# Patient Record
Sex: Female | Born: 1990 | Race: White | Hispanic: No | Marital: Married | Smoking: Never smoker
Health system: Southern US, Community
[De-identification: ages and names within clinical notes are randomized; demographics above are authoritative.]

## PROBLEM LIST (undated history)

## (undated) DIAGNOSIS — F32A Depression, unspecified: Secondary | ICD-10-CM

## (undated) DIAGNOSIS — Z9889 Other specified postprocedural states: Secondary | ICD-10-CM

## (undated) DIAGNOSIS — K219 Gastro-esophageal reflux disease without esophagitis: Secondary | ICD-10-CM

## (undated) DIAGNOSIS — N809 Endometriosis, unspecified: Secondary | ICD-10-CM

## (undated) DIAGNOSIS — F329 Major depressive disorder, single episode, unspecified: Secondary | ICD-10-CM

## (undated) DIAGNOSIS — F909 Attention-deficit hyperactivity disorder, unspecified type: Secondary | ICD-10-CM

## (undated) DIAGNOSIS — K648 Other hemorrhoids: Secondary | ICD-10-CM

## (undated) DIAGNOSIS — R51 Headache: Secondary | ICD-10-CM

## (undated) DIAGNOSIS — E559 Vitamin D deficiency, unspecified: Secondary | ICD-10-CM

## (undated) DIAGNOSIS — R112 Nausea with vomiting, unspecified: Secondary | ICD-10-CM

## (undated) DIAGNOSIS — N80129 Deep endometriosis of ovary, unspecified ovary: Secondary | ICD-10-CM

## (undated) DIAGNOSIS — Z975 Presence of (intrauterine) contraceptive device: Secondary | ICD-10-CM

## (undated) DIAGNOSIS — F419 Anxiety disorder, unspecified: Secondary | ICD-10-CM

## (undated) HISTORY — PX: HEMORRHOID BANDING: SHX5850

## (undated) HISTORY — DX: Anxiety disorder, unspecified: F41.9

## (undated) HISTORY — DX: Other hemorrhoids: K64.8

## (undated) HISTORY — DX: Headache: R51

## (undated) HISTORY — DX: Endometriosis, unspecified: N80.9

## (undated) HISTORY — DX: Major depressive disorder, single episode, unspecified: F32.9

## (undated) HISTORY — DX: Presence of (intrauterine) contraceptive device: Z97.5

## (undated) HISTORY — PX: HAND SURGERY: SHX662

## (undated) HISTORY — PX: BREAST SURGERY: SHX581

## (undated) HISTORY — PX: FOOT SURGERY: SHX648

## (undated) HISTORY — PX: WISDOM TOOTH EXTRACTION: SHX21

## (undated) HISTORY — DX: Depression, unspecified: F32.A

---

## 2011-02-06 ENCOUNTER — Emergency Department (HOSPITAL_COMMUNITY)
Admission: EM | Admit: 2011-02-06 | Discharge: 2011-02-07 | Disposition: A | Discharge status: Home / Self Care | Payer: Self-pay | Attending: Emergency Medicine | Admitting: Emergency Medicine

## 2011-02-06 DIAGNOSIS — Z0389 Encounter for observation for other suspected diseases and conditions ruled out: Secondary | ICD-10-CM | POA: Insufficient documentation

## 2012-08-30 ENCOUNTER — Ambulatory Visit: Payer: PRIVATE HEALTH INSURANCE | Admitting: Family Medicine

## 2012-08-30 VITALS — BP 114/74 | HR 81 | Temp 99.6°F | Resp 16 | Ht 63.0 in | Wt 136.6 lb

## 2012-08-30 DIAGNOSIS — B9689 Other specified bacterial agents as the cause of diseases classified elsewhere: Secondary | ICD-10-CM

## 2012-08-30 DIAGNOSIS — N760 Acute vaginitis: Secondary | ICD-10-CM

## 2012-08-30 DIAGNOSIS — N898 Other specified noninflammatory disorders of vagina: Secondary | ICD-10-CM

## 2012-08-30 LAB — POCT WET PREP WITH KOH
KOH Prep POC: NEGATIVE
RBC Wet Prep HPF POC: NEGATIVE
Trichomonas, UA: NEGATIVE
Yeast Wet Prep HPF POC: NEGATIVE

## 2012-08-30 MED ORDER — METRONIDAZOLE 500 MG PO TABS: 500.0000 mg | ORAL_TABLET | Freq: Two times a day (BID) | ORAL | Status: DC

## 2012-08-30 MED ORDER — FLUCONAZOLE 150 MG PO TABS: 150.0000 mg | ORAL_TABLET | Freq: Once | ORAL | Status: DC

## 2012-08-30 NOTE — Progress Notes (Signed)
[facility]:  Office Visit  Chief Complaint:  Chief Complaint  Patient presents with  . Vaginal Discharge    x 3 days  white/ thick   . Vaginal Itching    x 3 days     HPI: Jenny Delgado is a 21 y.o. female who complains of  Itching, white dc, smell. She gets this frequently so knows the symptoms.  No prior STDs. LMP 08/07/2012. Frequent BV after periods, using refresh gel.   Past Medical History  Diagnosis Date  . Anxiety   . Depression    Past Surgical History  Procedure Date  . Cesarean section    History   Social History  . Marital Status: Single    Spouse Name: N/A    Number of Children: N/A  . Years of Education: N/A   Social History Main Topics  . Smoking status: Never Smoker   . Smokeless tobacco: None  . Alcohol Use: No  . Drug Use: No  . Sexually Active: Yes    Birth Control/ Protection: None   Other Topics Concern  . None   Social History Narrative  . None   Family History  Problem Relation Age of Onset  . Hypertension Father   . Diabetes Maternal Grandfather    No Known Allergies Prior to Admission medications   Not on File     ROS: The patient denies fevers, chills, night sweats, unintentional weight loss, chest pain, palpitations, wheezing, dyspnea on exertion, nausea, vomiting, abdominal pain, dysuria, hematuria, melena, numbness, weakness, or tingling.   All other systems have been reviewed and were otherwise negative with the exception of those mentioned in the HPI and as above.    PHYSICAL EXAM: Filed Vitals:   08/30/12 1611  BP: 114/74  Pulse: 81  Temp: 99.6 F (37.6 C)  Resp: 16   Filed Vitals:   08/30/12 1611  Height: 5\' 3"  (1.6 m)  Weight: 136 lb 9.6 oz (61.961 kg)   Body mass index is 24.20 kg/(m^2).  General: Alert, no acute distress HEENT:  Normocephalic, atraumatic, oropharynx patent.  Cardiovascular:  Regular rate and rhythm, no rubs murmurs or gallops.  No Carotid bruits, radial pulse  intact. No pedal edema.  Respiratory: Clear to auscultation bilaterally.  No wheezes, rales, or rhonchi.  No cyanosis, no use of accessory musculature GI: No organomegaly, abdomen is soft and non-tender, positive bowel sounds.  No masses. Skin: No rashes. Neurologic: Facial musculature symmetric. Psychiatric: Patient is appropriate throughout our interaction. Lymphatic: No cervical lymphadenopathy Musculoskeletal: Gait intact. Gu-white dc, malodorus, masses or lesions, no CMT, cervix nl   LABS: Results for orders placed in visit on 08/30/12  POCT WET PREP WITH KOH      Component Value Range   Trichomonas, UA Negative     Clue Cells Wet Prep HPF POC 1-3     Epithelial Wet Prep HPF POC 3-7     Yeast Wet Prep HPF POC negative     Bacteria Wet Prep HPF POC 2+     RBC Wet Prep HPF POC negative     WBC Wet Prep HPF POC 1-5     KOH Prep POC Negative       EKG/XRAY:   Primary read interpreted by Dr. Con[facility] Rolls at [facility].   ASSESSMENT/PLAN: Encounter Diagnoses  Name Primary?  . Vaginal discharge Yes  . Bacterial vaginosis     Rx Flagyl and Diflucan prn   Jenny Dry PHUONG, DO 08/30/2012 11:38 PM

## 2012-12-28 ENCOUNTER — Encounter: Payer: Self-pay | Admitting: Obstetrics and Gynecology

## 2012-12-28 ENCOUNTER — Ambulatory Visit: Payer: No Typology Code available for payment source | Admitting: Obstetrics and Gynecology

## 2012-12-28 VITALS — BP 126/62 | Ht 64.0 in | Wt 139.0 lb

## 2012-12-28 DIAGNOSIS — B9689 Other specified bacterial agents as the cause of diseases classified elsewhere: Secondary | ICD-10-CM

## 2012-12-28 DIAGNOSIS — Z01419 Encounter for gynecological examination (general) (routine) without abnormal findings: Secondary | ICD-10-CM

## 2012-12-28 DIAGNOSIS — Z113 Encounter for screening for infections with a predominantly sexual mode of transmission: Secondary | ICD-10-CM

## 2012-12-28 LAB — POCT URINALYSIS DIPSTICK
Blood, UA: NEGATIVE
Protein, UA: NEGATIVE
Spec Grav, UA: 1.015
Urobilinogen, UA: NEGATIVE
pH, UA: 6

## 2012-12-28 MED ORDER — TINIDAZOLE 500 MG PO TABS: ORAL_TABLET | ORAL | Status: DC

## 2012-12-28 MED ORDER — NONFORMULARY OR COMPOUNDED ITEM: Status: DC

## 2012-12-28 NOTE — Progress Notes (Signed)
The patient reports:frequent vaginal itching and burning with odor. Pt thinks it happens at the end of her cycles on and off x 7 years. Uses RePHresh OTC which used to help but not recently.  Contraception:None   Last mammogram:none Last pap: 12/2011 per pt Normal  Pt states had colposcopy in 2006 or 2007.   GC/Chlamydia cultures offered: requested HIV/RPR/HbsAg offered:  requested HSV 1 and 2 glycoprotein offered: requested  Menstrual cycle regular and monthly: Yes every 30 - 32 days  Menstrual flow normal: Yes lasts 5-6 days with cramping on day 2 and day 3   Urinary symptoms: yes burning  Normal bowel movements: Yes Reports abuse at home: No:   Subjective:    Jenny Delgado is a 22 y.o. female, No obstetric history on file., who presents for an annual exam.     History   Social History  . Marital Status: Single    Spouse Name: N/A    Number of Children: N/A  . Years of Education: N/A   Social History Main Topics  . Smoking status: Never Smoker   . Smokeless tobacco: Not on file  . Alcohol Use: No  . Drug Use: No  . Sexually Active: Yes    Birth Control/ Protection: None   Other Topics Concern  . Not on file   Social History Narrative  . No narrative on file    Menstrual cycle:   LMP: Patient's last menstrual period was 12/10/2012.           Cycle: normal  The following portions of the patient's history were reviewed and updated as appropriate: allergies, current medications, past family history, past medical history, past social history, past surgical history and problem list.  Review of Systems Pertinent items are noted in HPI. Breast:Negative for breast lump,nipple discharge or nipple retraction Gastrointestinal: Negative for abdominal pain, change in bowel habits or rectal bleeding Urinary:negative   Objective:    BP 126/62  Ht 5\' 4"  (1.626 m)  Wt 139 lb (63.05 kg)  BMI 23.85 kg/m2  LMP 12/10/2012    Weight:  Wt Readings from Last 1 Encounters:   08/30/12 136 lb 9.6 oz (61.961 kg)          BMI: Body mass index is 23.85 kg/(m^2).  General Appearance: Alert, appropriate appearance for age. No acute distress HEENT: Grossly normal Neck / Thyroid: Supple, no masses, nodes or enlargement Lungs: clear to auscultation bilaterally Back: No CVA tenderness Breast Exam: No masses or nodes.No dimpling, nipple retraction or discharge. Cardiovascular: Regular rate and rhythm. S1, S2, no murmur Gastrointestinal: Soft, non-tender, no masses or organomegaly Pelvic Exam: Vulva and vagina appear normal. Bimanual exam reveals normal uterus and adnexa. Rectovaginal: not indicated Lymphatic Exam: Non-palpable nodes in neck, clavicular, axillary, or inguinal regions  Skin: no rash or abnormalities Neurologic: Normal gait and speech, no tremor  Psychiatric: Alert and oriented, appropriate affect.   OSOM BV: + Wet Prep: pH 5.0  Clues no yeast  Assessment:    Normal gyn exam  BV   Plan:    pap smear done, next pap due 2017 return annually or prn STD screening: done Contraception:no method Tindamax Boric Acid Suppositories Given   Silverio Lay MD

## 2012-12-28 NOTE — Addendum Note (Signed)
Addended by: Darien Ramus on: 12/28/2012 03:57 PM   Modules accepted: Orders

## 2012-12-29 LAB — HIV ANTIBODY (ROUTINE TESTING W REFLEX): HIV: NONREACTIVE

## 2012-12-29 LAB — HSV 2 ANTIBODY, IGG: HSV 2 Glycoprotein G Ab, IgG: <0.1 IV

## 2013-01-01 LAB — PAP IG, CT-NG, RFX HPV ASCU: Chlamydia Probe Amp: NEGATIVE

## 2013-01-01 NOTE — Progress Notes (Signed)
Quick Note:  LGSIL with unknown HPV: per [facility], repeat Pap in 1 year. Also please inform patient HSV 1 + ______

## 2013-01-03 ENCOUNTER — Telehealth: Payer: Self-pay

## 2013-01-03 NOTE — Telephone Encounter (Signed)
LVM for pt to return call.   Gavyn Zoss, CMA  

## 2013-01-03 NOTE — Telephone Encounter (Signed)
Message copied by Darien Ramus on Wed Jan 03, 2013  8:58 AM ------      Message from: Silverio Lay      Created: Mon Jan 01, 2013  8:21 PM       LGSIL with unknown HPV: per [facility], repeat Pap in 1 year.      Also please inform patient HSV 1 + ------

## 2013-01-08 NOTE — Telephone Encounter (Signed)
lvm for return call  Darien Ramus, CMA

## 2013-07-24 ENCOUNTER — Ambulatory Visit (INDEPENDENT_AMBULATORY_CARE_PROVIDER_SITE_OTHER): Payer: PRIVATE HEALTH INSURANCE | Admitting: Family Medicine

## 2013-07-24 VITALS — BP 118/62 | HR 89 | Temp 98.1°F | Resp 18 | Ht 62.75 in | Wt 138.0 lb

## 2013-07-24 DIAGNOSIS — B9689 Other specified bacterial agents as the cause of diseases classified elsewhere: Secondary | ICD-10-CM

## 2013-07-24 DIAGNOSIS — N898 Other specified noninflammatory disorders of vagina: Secondary | ICD-10-CM

## 2013-07-24 LAB — POCT WET PREP WITH KOH: Yeast Wet Prep HPF POC: NEGATIVE

## 2013-07-24 MED ORDER — METRONIDAZOLE 500 MG PO TABS: ORAL_TABLET | ORAL | Status: DC

## 2013-07-24 MED ORDER — FLUCONAZOLE 150 MG PO TABS: 150.0000 mg | ORAL_TABLET | Freq: Once | ORAL | Status: DC

## 2013-07-24 NOTE — Progress Notes (Signed)
[facility] [address] [place] [address] [phone]  Date:  07/24/2013   Name:  Jenny Delgado   DOB:  1991-01-27   MRN:  [id number]  PCP:  Pcp Not In System    Chief Complaint: vaginal discharge and odor   History of Present Illness:  Jenny Delgado is a 22 y.o. very pleasant female patient who presents with the following:  She is here with possible BV- she tends to get this a few times a year and has done so for a long time.   She has noted discharge and odor, no pain.    She is otherwise generally healthy except for anemia.   LMP 07/07/13.   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    She would like to try the 1 day flagyl course, and a diflucan She was recently tested for STI and was all clear.   There are no active problems to display for this patient.   Past Medical History  Diagnosis Date  . Anxiety   . Depression   . GNFAOZHY(865.7)     Past Surgical History  Procedure Laterality Date  . Wisdom tooth extraction    . Cesarean section  2008    History  Substance Use Topics  . Smoking status: Never Smoker   . Smokeless tobacco: Never Used  . Alcohol Use: Yes     Comment: ocassional wine coolers     Family History  Problem Relation Age of Onset  . Hypertension Father   . Diabetes Maternal Grandfather     No Known Allergies  Medication list has been reviewed and updated.  Current Outpatient Prescriptions on File Prior to Visit  Medication Sig Dispense Refill  . Aspirin-Salicylamide-Caffeine (BC HEADACHE POWDER PO) Take by mouth.      . fluconazole (DIFLUCAN) 150 MG tablet Take 1 tablet (150 mg total) by mouth once. May repeat prn  2 tablet  0  . metroNIDAZOLE (FLAGYL) 500 MG tablet Take 1 tablet (500 mg total) by mouth 2 (two) times daily.  14 tablet  0  . NONFORMULARY OR COMPOUNDED ITEM Boric Acid 600 mg vaginal suppositories. Place vaginally PRN  3 each  11  . tinidazole (TINDAMAX) 500 MG tablet Take 4  tablets by mouth today and tomorrow  8 tablet  0   No current facility-administered medications on file prior to visit.    Review of Systems:  As per HPI- otherwise negative.   Physical Examination: Filed Vitals:   07/24/13 1144  BP: 118/62  Pulse: 89  Temp: 98.1 F (36.7 C)  Resp: 18   Filed Vitals:   07/24/13 1144  Height: 5' 2.75" (1.594 m)  Weight: 138 lb (62.596 kg)   Body mass index is 24.64 kg/(m^2). Ideal Body Weight: Weight in (lb) to have BMI = 25: 139.7  GEN: WDWN, NAD, Non-toxic, A & O x 3, looks well HEENT: Atraumatic, Normocephalic. Neck supple. No masses, No LAD. Ears and Nose: No external deformity. CV: RRR, No M/G/R. No JVD. No thrill. No extra heart sounds. PULM: CTA B, no wheezes, crackles, rhonchi. No retractions. No resp. distress. No accessory muscle use. ABD: S, NT, ND. No rebound. No HSM. EXTR: No c/c/e NEURO Normal gait.  PSYCH: Normally interactive. Conversant. Not depressed or anxious appearing.  Calm demeanor.  Pt did a self- collect [facility]  Results for orders placed in visit on 07/24/13  POCT WET PREP WITH KOH      Result  Value Range   Trichomonas, UA Negative     Clue Cells Wet Prep HPF POC 1-3     Epithelial Wet Prep HPF POC 1-7     Yeast Wet Prep HPF POC neg     Bacteria Wet Prep HPF POC 2+     RBC Wet Prep HPF POC 1-3     WBC Wet Prep HPF POC 2-5     KOH Prep POC Negative      Assessment and Plan: Vaginal discharge - Plan: POCT Wet Prep with KOH, metroNIDAZOLE (FLAGYL) 500 MG tablet, fluconazole (DIFLUCAN) 150 MG tablet  Bacterial vaginosis - Plan: metroNIDAZOLE (FLAGYL) 500 MG tablet, fluconazole (DIFLUCAN) 150 MG tablet  Treat for BV and prevent yeast vaginitis with flagyl and diflucan. She will let me know if not better- Sooner if worse.     Signed Abbe Amsterdam, MD

## 2015-03-27 ENCOUNTER — Encounter: Payer: Self-pay | Admitting: Obstetrics and Gynecology

## 2015-03-27 ENCOUNTER — Ambulatory Visit (INDEPENDENT_AMBULATORY_CARE_PROVIDER_SITE_OTHER): Payer: 59 | Admitting: Obstetrics and Gynecology

## 2015-03-27 VITALS — BP 109/72 | HR 76 | Ht 63.0 in | Wt 144.7 lb

## 2015-03-27 DIAGNOSIS — F329 Major depressive disorder, single episode, unspecified: Secondary | ICD-10-CM | POA: Insufficient documentation

## 2015-03-27 DIAGNOSIS — F32A Depression, unspecified: Secondary | ICD-10-CM | POA: Insufficient documentation

## 2015-03-27 DIAGNOSIS — Z975 Presence of (intrauterine) contraceptive device: Secondary | ICD-10-CM | POA: Insufficient documentation

## 2015-03-27 DIAGNOSIS — F419 Anxiety disorder, unspecified: Secondary | ICD-10-CM

## 2015-03-27 DIAGNOSIS — Z30432 Encounter for removal of intrauterine contraceptive device: Secondary | ICD-10-CM

## 2015-03-27 DIAGNOSIS — Z8742 Personal history of other diseases of the female genital tract: Secondary | ICD-10-CM | POA: Insufficient documentation

## 2015-03-27 NOTE — Patient Instructions (Signed)
Begin taking a prenatal vitamin daily.  Follow up as needed.

## 2015-03-27 NOTE — Procedures (Signed)
GYNECOLOGY CLINIC PROCEDURE NOTE  HPI:  Jenny Delgado is a 24 y.o. [phone] here for Mirena IUD removal. No GYN concerns.  Desires to attempt to conceive again.  ROS: Review of Systems negative  Exam:  Blood pressure 109/72, pulse 76, height 5\' 3"  (1.6 m), weight 144 lb 11.2 oz (65.635 kg). Deferred.   IUD Removal Procedure Note Patient was placed in the dorsal lithotomy position, normal external genitalia was noted.  A speculum was placed in the patient's vagina, normal discharge was noted, no lesions. The cervix was visualized, no lesions, no abnormal discharge.  The strings of the IUD were grasped and pulled using ring forceps. The IUD was removed in its entirety. Patient tolerated the procedure well.   Plan: Patient plans for pregnancy soon and she was told to avoid teratogens, take PNV and folic acid.  Routine preventative health maintenance measures emphasized.  Hildred Laser, MD [facility]

## 2015-03-27 NOTE — Progress Notes (Signed)
See procedure note for details of procedure.  Patient presented for IUD removal.

## 2015-05-28 ENCOUNTER — Telehealth: Payer: Self-pay | Admitting: Obstetrics and Gynecology

## 2015-05-28 NOTE — Telephone Encounter (Signed)
PT CALLED AND SHE IS SET UP TO COME IN NEXT WEEK FOR HER CONFIRAMTION, AND ACCORDING TO HER LMP SHE WILL BE AROUNF 8 WEEKS, AND SHE IS HAVING SOME CRAMPING AND BLEEDING AND SHE DIDN'T KNOW WHAT SHE NEEDED TO DO. 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

## 2015-05-29 NOTE — Telephone Encounter (Addendum)
Wearing maxi-pad, maybe in a day 1/3 full and min. Cramping. Coming in for [place] 05/30/15 per Dr. Valentino Saxon.

## 2015-05-30 ENCOUNTER — Encounter: Payer: Self-pay | Admitting: Obstetrics and Gynecology

## 2015-05-30 ENCOUNTER — Ambulatory Visit: Payer: 59

## 2015-05-30 ENCOUNTER — Ambulatory Visit (INDEPENDENT_AMBULATORY_CARE_PROVIDER_SITE_OTHER): Payer: 59 | Admitting: Obstetrics and Gynecology

## 2015-05-30 VITALS — BP 102/64 | HR 67 | Ht 63.0 in | Wt 145.4 lb

## 2015-05-30 DIAGNOSIS — O021 Missed abortion: Secondary | ICD-10-CM | POA: Diagnosis not present

## 2015-05-30 DIAGNOSIS — N8320 Unspecified ovarian cysts: Secondary | ICD-10-CM | POA: Diagnosis not present

## 2015-05-30 DIAGNOSIS — Z3687 Encounter for antenatal screening for uncertain dates: Secondary | ICD-10-CM

## 2015-05-30 DIAGNOSIS — N83202 Unspecified ovarian cyst, left side: Secondary | ICD-10-CM | POA: Insufficient documentation

## 2015-05-30 DIAGNOSIS — O4690 Antepartum hemorrhage, unspecified, unspecified trimester: Secondary | ICD-10-CM

## 2015-05-30 MED ORDER — MISOPROSTOL 200 MCG PO TABS: 800.0000 ug | ORAL_TABLET | Freq: Once | ORAL | Status: DC

## 2015-05-30 MED ORDER — HYDROCODONE-ACETAMINOPHEN 5-325 MG PO TABS: 1.0000 | ORAL_TABLET | Freq: Four times a day (QID) | ORAL | Status: DC | PRN

## 2015-05-30 NOTE — Patient Instructions (Signed)
Miscarriage °A miscarriage is the loss of an unborn baby (fetus) before the 20th week of pregnancy. The cause is often unknown.  °HOME CARE °· You may need to stay in bed (bed rest), or you may be able to do light activity. Go about activity as told by your doctor. °· Have help at home. °· Write down how many pads you use each day. Write down how soaked they are. °· Do not use tampons. Do not wash out your vagina (douche) or have sex (intercourse) until your doctor approves. °· Only take medicine as told by your doctor. °· Do not take aspirin. °· Keep all doctor visits as told. °· If you or your partner have problems with grieving, talk to your doctor. You can also try counseling. Give yourself time to grieve before trying to get pregnant again. °GET HELP RIGHT AWAY IF: °· You have bad cramps or pain in your back or belly (abdomen). °· You have a fever. °· You pass large clumps of blood (clots) from your vagina that are walnut-sized or larger. Save the clumps for your doctor to see. °· You pass large amounts of tissue from your vagina. Save the tissue for your doctor to see. °· You have more bleeding. °· You have thick, bad-smelling fluid (discharge) coming from the vagina. °· You get lightheaded, weak, or you pass out (faint). °· You have chills. °MAKE SURE YOU: °· Understand these instructions. °· Will watch your condition. °· Will get help right away if you are not doing well or get worse. °Document Released: 12/27/2011 Document Reviewed: 12/27/2011 °ExitCare® Patient Information ©2015 [employer]. This information is not intended to replace advice given to you by your health care provider. Make sure you discuss any questions you have with your health care provider. ° °

## 2015-05-30 NOTE — Progress Notes (Unsigned)
Patient ID: Jenny Delgado, female   DOB: December 08, 1990, 24 y.o.   MRN: [id number] Pt in today for ultrasound per Dr. Valentino Saxon due to vaginal spotting, 1/3 of pad all day and some mild abdominal cramping. Positive home pregnancy test, for confirmation next week.

## 2015-05-30 NOTE — Progress Notes (Signed)
Patient ID: Jenny Delgado, female   DOB: [birth year], 24 y.o.   MRN: [id number] Pt presents for f/u u/s. Pos upt at home. Now bleeding.

## 2015-05-30 NOTE — Progress Notes (Addendum)
Subjective:    Carnisha Feltz is a 24 y.o. [phone] female. LMP 04/05/2015, with EDD 01/03/2016 by dates, EGA [redacted]w[redacted]d. Positive UPT at home.  Sudie reports bleeding since 3 days ago, light, requiring 1 pad daily.  Denies passage of tissue products or blood products. Does note some mild occasional cramping.  She is not in acute distress. Ectopic risks: none.      The following portions of the patient's history were reviewed and updated as appropriate: allergies, current medications, past family history, past medical history, past social history, past surgical history and problem list.  Review of Systems Pertinent items are noted in HPI.   Objective:     BP 102/64 mmHg  Pulse 67  Ht  (1.6 m)  Wt 145 lb 6.4 oz (65.953 kg)  BMI 25.76 kg/m2 General:   alert and no distress  Abdomen: soft, non-tender, without masses or organomegaly  Pelvic: Vulva and vagina appear normal. Bimanual exam reveals normal uterus and adnexa.  Cervix closed. Scant blood in vaginal vault.    Imaging Office ultrasound 05/30/2015:   Findings:  Singleton intrauterine pregnancy is visualized with a CRL consistent with 6 0/[redacted] weeks gestation. No FHB was visualized by color doppler; m-mode; or PW. The (U/S) EDD is NOT consistent with the clinically established (LMP) EDD of 01-03-16.  FHR: 0 CRL measurement: 3.2 mm  Right Ovary measures 1.7 x 1.6 x 1.4 cm. It is normal in appearance.  Left Ovary measures 4.8 x 2.9 x 3.1 cm. It contains a simple appearing cyst measuring 3.6 x 2.6 x 4.0 cm.  Survey of the adnexa demonstrates no adnexal masses.  There is no free peritoneal fluid in the cul de sac.    Assessment:     History of habitual abortion Missed abortion at [redacted] weeks gestation     4 cm left ovarian cyst, simple  Plan:   Discussed management of missed abortion: expectant management vs misoprostol vs D&C.  Risks and benefits of all modalities discussed; all questions answered.  Patient opted for medical  management with Cytotec.  She was told to call if she changes her mind, or if there is no passage of tissue after 2nd dose of Cytotec (to be taken 24-48 hours after initial dose if no bleeding occurs) or develops any symptoms of infection.  Bleeding precautions reviewed; she was told to call clinic or go to the Emergency Room for any concerns.  Patient understands that if products are still retained, she would need to progress to a D&C at that time.  Blood type and Rh: pending. Quantitative hCG today. Simple left ovarian cyst.  Asymptomatic.  Will likely resolve without intervention.  Will f/u on next scan.  Follow-up appointment with 1 week for repeat labs and ultrasound to ensure expulsion of all products.  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   Hildred Laser, MD [facility]

## 2015-05-31 LAB — BETA HCG QUANT (REF LAB): hCG Quant: 17912 m[IU]/mL

## 2015-06-03 ENCOUNTER — Emergency Department (HOSPITAL_COMMUNITY)
Admission: EM | Admit: 2015-06-03 | Discharge: 2015-06-03 | Disposition: A | Discharge status: Home / Self Care | Payer: 59 | Attending: Emergency Medicine | Admitting: Emergency Medicine

## 2015-06-03 ENCOUNTER — Encounter (HOSPITAL_COMMUNITY): Payer: Self-pay | Admitting: Emergency Medicine

## 2015-06-03 ENCOUNTER — Ambulatory Visit: Payer: 59 | Admitting: Obstetrics and Gynecology

## 2015-06-03 ENCOUNTER — Emergency Department (HOSPITAL_COMMUNITY): Payer: 59

## 2015-06-03 DIAGNOSIS — O209 Hemorrhage in early pregnancy, unspecified: Secondary | ICD-10-CM | POA: Diagnosis present

## 2015-06-03 DIAGNOSIS — O039 Complete or unspecified spontaneous abortion without complication: Secondary | ICD-10-CM | POA: Insufficient documentation

## 2015-06-03 DIAGNOSIS — Z8659 Personal history of other mental and behavioral disorders: Secondary | ICD-10-CM | POA: Insufficient documentation

## 2015-06-03 DIAGNOSIS — Z3A01 Less than 8 weeks gestation of pregnancy: Secondary | ICD-10-CM | POA: Insufficient documentation

## 2015-06-03 LAB — BASIC METABOLIC PANEL
Anion gap: 7 (ref 5–15)
BUN: 8 mg/dL (ref 6–20)
CO2: 26 mmol/L (ref 22–32)
CREATININE: 0.6 mg/dL (ref 0.44–1.00)
Calcium: 8.9 mg/dL (ref 8.9–10.3)
Chloride: 105 mmol/L (ref 101–111)
GFR calc Af Amer: >60 mL/min (ref >60)
Glucose, Bld: 103 mg/dL — ABNORMAL HIGH (ref 65–99)
Potassium: 3.9 mmol/L (ref 3.5–5.1)
SODIUM: 138 mmol/L (ref 135–145)

## 2015-06-03 LAB — CBC WITH DIFFERENTIAL/PLATELET
Basophils Absolute: 0 10*3/uL (ref 0.0–0.1)
Basophils Relative: 0 % (ref 0–1)
EOS ABS: 0.1 10*3/uL (ref 0.0–0.7)
Eosinophils Relative: 2 % (ref 0–5)
HCT: 34.2 % — ABNORMAL LOW (ref 36.0–46.0)
Hemoglobin: 11.3 g/dL — ABNORMAL LOW (ref 12.0–15.0)
LYMPHS ABS: 1.5 10*3/uL (ref 0.7–4.0)
Lymphocytes Relative: 18 % (ref 12–46)
MCH: 25.7 pg — AB (ref 26.0–34.0)
MCHC: 33 g/dL (ref 30.0–36.0)
MCV: 77.7 fL — ABNORMAL LOW (ref 78.0–100.0)
MONOS PCT: 4 % (ref 3–12)
Monocytes Absolute: 0.3 10*3/uL (ref 0.1–1.0)
Neutro Abs: 6.4 10*3/uL (ref 1.7–7.7)
Neutrophils Relative %: 76 % (ref 43–77)
PLATELETS: 159 10*3/uL (ref 150–400)
RBC: 4.4 MIL/uL (ref 3.87–5.11)
RDW: 14.2 % (ref 11.5–15.5)
WBC: 8.4 10*3/uL (ref 4.0–10.5)

## 2015-06-03 MED ORDER — IBUPROFEN 800 MG PO TABS: 800.0000 mg | ORAL_TABLET | Freq: Three times a day (TID) | ORAL | Status: DC

## 2015-06-03 MED ORDER — HYDROCODONE-ACETAMINOPHEN 5-325 MG PO TABS
2.0000 | ORAL_TABLET | Freq: Once | ORAL | Status: AC
Start: 2015-06-03 — End: 2015-06-03
  Administered 2015-06-03: 2 via ORAL
  Filled 2015-06-03: qty 2

## 2015-06-03 MED ORDER — IBUPROFEN 800 MG PO TABS
800.0000 mg | ORAL_TABLET | Freq: Once | ORAL | Status: AC
  Administered 2015-06-03: 800 mg via ORAL
  Filled 2015-06-03: qty 1

## 2015-06-03 NOTE — ED Notes (Signed)
Pt was given Cytotec on Friday by Dr Valentino Saxon in [place] to facilitate miscarriage.  Woke up at 2 am with several cramping, passing clots and lightheadedness.  C/o lower abdominal cramping.  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  Have one living child.

## 2015-06-03 NOTE — ED Provider Notes (Signed)
CSN: [id number]     Arrival date & time 06/03/15  0732 History  This chart was scribed for Eber Hong, MD by Octavia Heir, ED Scribe. This patient was seen in room APA11/APA11 and the patient's care was started at 8:26 AM.    Chief Complaint  Patient presents with  . Vaginal Bleeding      The history is provided by the patient. No language interpreter was used.   HPI Comments: Jenny Delgado is a 24 y.o. female who is [redacted] weeks pregnant presents to the Emergency Department complaining of constant, gradual worsening vaginal bleeding onset last night. Pt has had associated abdominal cramps for the past 2 days, increased pain in the past 24 hours and reports several large clots this morning. Pt saw her Ob/gyn on 05/30/15 and received Cytotec to facilitate passage of uterine contents. She has past medcial hx of having one miscarriage previously. Pt states there are no modifying or aggravating factors. She denies fevers and vomiting.  Ob/Gyn: Dr. Valentino Saxon  Past Medical History  Diagnosis Date  . Anxiety   . Depression   . Headache(784.0)   . IUD contraception     mirena   Past Surgical History  Procedure Laterality Date  . Wisdom tooth extraction    . Cesarean section  2008   Family History  Problem Relation Age of Onset  . Hypertension Father   . Diabetes Maternal Grandfather   . Breast cancer Neg Hx   . Cervical cancer Neg Hx   . Colon cancer Neg Hx   . Ovarian cancer Neg Hx   . Heart disease Neg Hx    Social History  Substance Use Topics  . Smoking status: Never Smoker   . Smokeless tobacco: Never Used  . Alcohol Use: Yes     Comment: ocassional wine coolers    OB History    Gravida Para Term Preterm AB TAB SAB Ectopic Multiple Living   5 1 1  4  3   1      Review of Systems  All other systems reviewed and are negative.     Allergies  Lexapro and Zoloft  Home Medications   Prior to Admission medications   Medication Sig Start Date End Date Taking?  Authorizing Provider  Aspirin-Salicylamide-Caffeine (BC HEADACHE POWDER PO) Take by mouth.   Yes Historical Provider, MD  ibuprofen (ADVIL,MOTRIN) 800 MG tablet Take 1 tablet (800 mg total) by mouth 3 (three) times daily. 06/03/15   Eber Hong, MD   Triage vitals: BP 113/74 mmHg  Temp(Src) 97.5 F (36.4 C)  Resp 16  Ht 5\' 3"  (1.6 m)  Wt 145 lb (65.772 kg)  BMI 25.69 kg/m2  SpO2 100% Physical Exam  Constitutional: She appears well-developed and well-nourished. No distress.  HENT:  Head: Normocephalic and atraumatic.  Mouth/Throat: Oropharynx is clear and moist. No oropharyngeal exudate.  Eyes: Conjunctivae and EOM are normal. Pupils are equal, round, and reactive to light. Right eye exhibits no discharge. Left eye exhibits no discharge. No scleral icterus.  Neck: Normal range of motion. Neck supple. No JVD present. No thyromegaly present.  Cardiovascular: Normal rate, regular rhythm, normal heart sounds and intact distal pulses.  Exam reveals no gallop and no friction rub.   No murmur heard. Pulmonary/Chest: Effort normal and breath sounds normal. No respiratory distress. She has no wheezes. She has no rales.  Abdominal: Soft. Bowel sounds are normal. She exhibits no distension and no mass. There is no tenderness.  Suprapubic tenderness without guarding  or peritoneal signs  Genitourinary:  Chaperone present: Large about of vaginal bleeding, uterine present   Musculoskeletal: Normal range of motion. She exhibits no edema or tenderness.  Lymphadenopathy:    She has no cervical adenopathy.  Neurological: She is alert. Coordination normal.  Skin: Skin is warm and dry. No rash noted. No erythema.  Psychiatric: She has a normal mood and affect. Her behavior is normal.  Nursing note and vitals reviewed.   ED Course  Procedures  DIAGNOSTIC STUDIES: Oxygen Saturation is 100% on RA, normal by my interpretation.  COORDINATION OF CARE:  8:32 AM Discussed treatment plan which includes  pelvic exam with pt at bedside and pt agreed to plan.  Labs Review Labs Reviewed  CBC WITH DIFFERENTIAL/PLATELET - Abnormal; Notable for the following:    Hemoglobin 11.3 (*)    HCT 34.2 (*)    MCV 77.7 (*)    MCH 25.7 (*)    All other components within normal limits  BASIC METABOLIC PANEL - Abnormal; Notable for the following:    Glucose, Bld 103 (*)    All other components within normal limits    Imaging Review US Ob Comp Less 14 Wks  06/03/2015   CLINICAL DATA:  Pregnant patient with vaginal bleeding. Quantitative HCG 4,732.  EXAM: OBSTETRIC <14 WK [place] AND TRANSVAGINAL OB US  TECHNIQUE: Both transabdominal and transvaginal ultrasound examinations were performed for complete evaluation of the gestation as well as the maternal uterus, adnexal regions, and pelvic cul-de-sac. Transvaginal technique was performed to assess early pregnancy.  COMPARISON:  None.  FINDINGS: Intrauterine gestational sac: A gestational sac versus fluid is identified in the lower uterine segment.  Yolk sac:  Not visualized.  Embryo:  Not visualized.  Cardiac Activity: Not applicable.  MSD: 18.8  mm   6 w   6  d  Maternal uterus/adnexae: The left ovary measures 6.4 x 3.0 x 4.9 cm with a simple cyst in the ovary measuring 4.2 x 3.0 x 3.0 cm. The right ovary is not visualized.  IMPRESSION: Findings most consistent with abortion in progress.  Simple left ovarian cyst.  The right ovary is not visualized.   Electronically Signed   By: Drusilla Kanner M.D.   On: 06/03/2015 10:09   US Ob Transvaginal  06/03/2015   CLINICAL DATA:  Pregnant patient with vaginal bleeding. Quantitative HCG 4,732.  EXAM: OBSTETRIC <14 WK [place] AND TRANSVAGINAL OB US  TECHNIQUE: Both transabdominal and transvaginal ultrasound examinations were performed for complete evaluation of the gestation as well as the maternal uterus, adnexal regions, and pelvic cul-de-sac. Transvaginal technique was performed to assess early pregnancy.  COMPARISON:  None.   FINDINGS: Intrauterine gestational sac: A gestational sac versus fluid is identified in the lower uterine segment.  Yolk sac:  Not visualized.  Embryo:  Not visualized.  Cardiac Activity: Not applicable.  MSD: 18.8  mm   6 w   6  d  Maternal uterus/adnexae: The left ovary measures 6.4 x 3.0 x 4.9 cm with a simple cyst in the ovary measuring 4.2 x 3.0 x 3.0 cm. The right ovary is not visualized.  IMPRESSION: Findings most consistent with abortion in progress.  Simple left ovarian cyst.  The right ovary is not visualized.   Electronically Signed   By: Drusilla Kanner M.D.   On: 06/03/2015 10:09     MDM   Final diagnoses:  Inevitable complete miscarriage without complication    Care discussed with the OB/GYN at 8:45 AM, Dr. Valentino Saxon  states that the patient has follow-up with her in the office. If ultrasound shows ongoing large amount of products of conception or blood she would consider dilation and curettage, if uterus empty, symptoms would likely continue to improve throughout the day. The patient does have a follow-up in the next couple of days.  I personally performed the services described in this documentation, which was scribed in my presence. The recorded information has been reviewed and is accurate.    [place] report viewed and reviewed with pt - stable for d/c.  Meds given in ED:  Medications  ibuprofen (ADVIL,MOTRIN) tablet 800 mg (800 mg Oral Given 06/03/15 0945)    New Prescriptions   IBUPROFEN (ADVIL,MOTRIN) 800 MG TABLET    Take 1 tablet (800 mg total) by mouth 3 (three) times daily.      Eber Hong, MD 06/03/15 1102

## 2015-06-03 NOTE — Discharge Instructions (Signed)

## 2015-06-06 ENCOUNTER — Encounter: Payer: Self-pay | Admitting: Obstetrics and Gynecology

## 2015-06-06 ENCOUNTER — Ambulatory Visit (INDEPENDENT_AMBULATORY_CARE_PROVIDER_SITE_OTHER): Payer: 59 | Admitting: Obstetrics and Gynecology

## 2015-06-06 ENCOUNTER — Ambulatory Visit: Payer: 59

## 2015-06-06 ENCOUNTER — Telehealth: Payer: Self-pay

## 2015-06-06 VITALS — BP 92/56 | HR 73 | Ht 63.0 in | Wt 146.1 lb

## 2015-06-06 DIAGNOSIS — O021 Missed abortion: Secondary | ICD-10-CM

## 2015-06-06 DIAGNOSIS — N939 Abnormal uterine and vaginal bleeding, unspecified: Secondary | ICD-10-CM | POA: Diagnosis not present

## 2015-06-06 DIAGNOSIS — O039 Complete or unspecified spontaneous abortion without complication: Secondary | ICD-10-CM

## 2015-06-06 DIAGNOSIS — O4690 Antepartum hemorrhage, unspecified, unspecified trimester: Secondary | ICD-10-CM

## 2015-06-06 LAB — HEMOGLOBIN AND HEMATOCRIT, BLOOD
HEMATOCRIT: 26.8 % — AB (ref 34.0–46.6)
Hemoglobin: 9 g/dL — ABNORMAL LOW (ref 11.1–15.9)

## 2015-06-06 MED ORDER — OXYCODONE-ACETAMINOPHEN 5-325 MG PO TABS: 1.0000 | ORAL_TABLET | Freq: Four times a day (QID) | ORAL | Status: DC | PRN

## 2015-06-06 MED ORDER — METHYLERGONOVINE MALEATE 0.2 MG PO TABS: 0.2000 mg | ORAL_TABLET | Freq: Four times a day (QID) | ORAL | Status: DC

## 2015-06-06 NOTE — Progress Notes (Signed)
Patient ID: Jenny Delgado, female   DOB: 1990/12/02, 24 y.o.   MRN: [id number] U/s results and labs

## 2015-06-06 NOTE — Telephone Encounter (Signed)
Pt aware HGB is a 9. Per [facility] she needs to take iron bid x 1 month.

## 2015-06-07 NOTE — Progress Notes (Signed)
GYNECOLOGY PROGRESS NOTE  Subjective:    Patient ID: Jenny Delgado, female    DOB: 12-28-1990, 24 y.o.   MRN: [id number]  HPI  Patient is a 24 y.o. G31P1041 female who presents for f/u 1 week after Cytotec administration for missed AB at 6 weeks.  Reports that she was seen in the ER [facility]) 3 days ago for moderate cramping and bleeding.  Still noting passage of large blood clots. Was noted to be stable hemodynamically, ultrasound showed gestational sac at LUS.    The following portions of the patient's history were reviewed and updated as appropriate: allergies, current medications, past family history, past medical history, past social history, past surgical history and problem list.  Review of Systems A comprehensive review of systems was negative except for: Constitutional: positive for fatigue Genitourinary: positive for heavy vaginal bleeding and painful menstrual cramping   Objective:   Blood pressure 92/56, pulse 73, height  (1.6 m), weight 146 lb 1.6 oz (66.271 kg). General appearance: alert, fatigued and no distress Abdomen: soft, non-tender; bowel sounds normal; no masses,  no organomegaly Pelvic: external genitalia normal and vagina with moderate blood in vaginal vault. Cervix closed, nontender, no CMT. Uterus mobile, notender, 6-8 week sized. Extremities: extremities normal, atraumatic, no cyanosis or edema Neurologic: Grossly normal  Labs:  CBC Latest Ref Rng 06/03/2015  WBC 4.0 - 10.5 K/uL 8.4  Hemoglobin 12.0 - 15.0 g/dL 11.3(L)  Hematocrit 34.0 - 46.6 % 34.2(L)  Platelets 150 - 400 K/uL 159   BHCG:  05/30/2015: 17912 06/03/2015:  4732   Imaging:  Pelvic US 06/03/2015 FINDINGS: Intrauterine gestational sac: A gestational sac versus fluid is identified in the lower uterine segment. Yolk sac: Not visualized. Embryo: Not visualized. Cardiac Activity: Not applicable.  MSD: 18.8 mm 6 w 6 d  Maternal uterus/adnexae: The left ovary measures 6.4  x 3.0 x 4.9 cm with a simple cyst in the ovary measuring 4.2 x 3.0 x 3.0 cm. The right ovary is not visualized.  Pelvic US 06/06/2015 FINDINGS:  No intrauterine pregnancy is seen on today's exam. The fluid collection seen on the ultrasound 3 days ago is not visualized today. No gestational sac is seen. The endometrium measures 10.2 mm at the LUS appears slightly heterogeneous- probable blood within the endometrial canal.   Right Ovary measures 2.6 x 1.4 x 2.0 cm. It is normal in appearance. Left Ovary measures 3.5 x 2.1 x 3.1 cm. There is a simple appearing cyst which measures 2.6 x 2.0 x 2.4 cm. There is no evidence of a corpus luteal cyst. Survey of the adnexa demonstrates no adnexal masses. There is a trace amount of free peritoneal fluid in the cul de sac.  Assessment:   Complete Ab s/p Cytotec Fatigue Persistent heavy vaginal bleeding and cramping  Plan:   1. Discussed recommendations of waiting at least 2-3 menstrual cycles prior to attempting conception again.  2. Will get stat Hgb/HCT 3. Prescribed Percocet for pain (patient notes Norco prescribed helped, but only offered modest relief, and has run out) and Methergine for bleeding.  Given bleeding precautions.  4. Follow up as needed.    Hildred Laser, MD [facility]

## 2015-10-01 ENCOUNTER — Ambulatory Visit (INDEPENDENT_AMBULATORY_CARE_PROVIDER_SITE_OTHER): Payer: BC Managed Care – PPO | Admitting: Obstetrics and Gynecology

## 2015-10-01 ENCOUNTER — Encounter: Payer: Self-pay | Admitting: Obstetrics and Gynecology

## 2015-10-01 VITALS — BP 113/63 | HR 73 | Ht 63.0 in | Wt 148.5 lb

## 2015-10-01 DIAGNOSIS — Z8742 Personal history of other diseases of the female genital tract: Secondary | ICD-10-CM

## 2015-10-01 DIAGNOSIS — O34219 Maternal care for unspecified type scar from previous cesarean delivery: Secondary | ICD-10-CM

## 2015-10-01 DIAGNOSIS — Z8759 Personal history of other complications of pregnancy, childbirth and the puerperium: Secondary | ICD-10-CM

## 2015-10-01 DIAGNOSIS — N926 Irregular menstruation, unspecified: Secondary | ICD-10-CM | POA: Diagnosis not present

## 2015-10-01 LAB — POCT URINE PREGNANCY: Preg Test, Ur: POSITIVE — AB

## 2015-10-01 MED ORDER — ONDANSETRON 4 MG PO TBDP: 4.0000 mg | ORAL_TABLET | Freq: Four times a day (QID) | ORAL | Status: DC | PRN

## 2015-10-01 MED ORDER — PROMETHAZINE HCL 25 MG PO TABS: 25.0000 mg | ORAL_TABLET | Freq: Four times a day (QID) | ORAL | Status: DC | PRN

## 2015-10-01 NOTE — Progress Notes (Signed)
Patient ID: Colletta MarylandStephanie Hamza, female   DOB: 12/26/1990, 26 y.o.   MRN: [id number]  Here for pregnancy confirmation.  S: Missed menses with nausea and occasional vomiting, fatigue, denies any bleeding since LMP 08/02/15.  [id number], with 1 IAB, 2 SAB followed by 1 term c/s delivery for FTP, then another 8 week MAB this August.   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  RX for zofran and phenergan sent in. o continue PNVs.  Harlow MaresMelody Shambley, CNM

## 2015-10-01 NOTE — Patient Instructions (Signed)
First Trimester of Pregnancy The first trimester of pregnancy is from week 1 until the end of week 12 (months 1 through 3). A week after a sperm fertilizes an egg, the egg will implant on the wall of the uterus. This embryo will begin to develop into a baby. Genes from you and your partner are forming the baby. The female genes determine whether the baby is a boy or a girl. At 6-8 weeks, the eyes and face are formed, and the heartbeat can be seen on ultrasound. At the end of 12 weeks, all the baby's organs are formed.  Now that you are pregnant, you will want to do everything you can to have a healthy baby. Two of the most important things are to get good prenatal care and to follow your health care provider's instructions. Prenatal care is all the medical care you receive before the baby's birth. This care will help prevent, find, and treat any problems during the pregnancy and childbirth. BODY CHANGES Your body goes through many changes during pregnancy. The changes vary from woman to woman.   You may gain or lose a couple of pounds at first.  You may feel sick to your stomach (nauseous) and throw up (vomit). If the vomiting is uncontrollable, call your health care provider.  You may tire easily.  You may develop headaches that can be relieved by medicines approved by your health care provider.  You may urinate more often. Painful urination may mean you have a bladder infection.  You may develop heartburn as a result of your pregnancy.  You may develop constipation because certain hormones are causing the muscles that push waste through your intestines to slow down.  You may develop hemorrhoids or swollen, bulging veins (varicose veins).  Your breasts may begin to grow larger and become tender. Your nipples may stick out more, and the tissue that surrounds them (areola) may become darker.  Your gums may bleed and may be sensitive to brushing and flossing.  Dark spots or blotches (chloasma,  mask of pregnancy) may develop on your face. This will likely fade after the baby is born.  Your menstrual periods will stop.  You may have a loss of appetite.  You may develop cravings for certain kinds of food.  You may have changes in your emotions from day to day, such as being excited to be pregnant or being concerned that something may go wrong with the pregnancy and baby.  You may have more vivid and strange dreams.  You may have changes in your hair. These can include thickening of your hair, rapid growth, and changes in texture. Some women also have hair loss during or after pregnancy, or hair that feels dry or thin. Your hair will most likely return to normal after your baby is born. WHAT TO EXPECT AT YOUR PRENATAL VISITS During a routine prenatal visit:  You will be weighed to make sure you and the baby are growing normally.  Your blood pressure will be taken.  Your abdomen will be measured to track your baby's growth.  The fetal heartbeat will be listened to starting around week 10 or 12 of your pregnancy.  Test results from any previous visits will be discussed. Your health care provider may ask you:  How you are feeling.  If you are feeling the baby move.  If you have had any abnormal symptoms, such as leaking fluid, bleeding, severe headaches, or abdominal cramping.  If you are using any tobacco products,   including cigarettes, chewing tobacco, and electronic cigarettes.  If you have any questions. Other tests that may be performed during your first trimester include:  Blood tests to find your blood type and to check for the presence of any previous infections. They will also be used to check for low iron levels (anemia) and Rh antibodies. Later in the pregnancy, blood tests for diabetes will be done along with other tests if problems develop.  Urine tests to check for infections, diabetes, or protein in the urine.  An ultrasound to confirm the proper growth  and development of the baby.  An amniocentesis to check for possible genetic problems.  Fetal screens for spina bifida and Down syndrome.  You may need other tests to make sure you and the baby are doing well.  HIV (human immunodeficiency virus) testing. Routine prenatal testing includes screening for HIV, unless you choose not to have this test. HOME CARE INSTRUCTIONS  Medicines  Follow your health care provider's instructions regarding medicine use. Specific medicines may be either safe or unsafe to take during pregnancy.  Take your prenatal vitamins as directed.  If you develop constipation, try taking a stool softener if your health care provider approves. Diet  Eat regular, well-balanced meals. Choose a variety of foods, such as meat or vegetable-based protein, fish, milk and low-fat dairy products, vegetables, fruits, and whole grain breads and cereals. Your health care provider will help you determine the amount of weight gain that is right for you.  Avoid raw meat and uncooked cheese. These carry germs that can cause birth defects in the baby.  Eating four or five small meals rather than three large meals a day may help relieve nausea and vomiting. If you start to feel nauseous, eating a few soda crackers can be helpful. Drinking liquids between meals instead of during meals also seems to help nausea and vomiting.  If you develop constipation, eat more high-fiber foods, such as fresh vegetables or fruit and whole grains. Drink enough fluids to keep your urine clear or pale yellow. Activity and Exercise  Exercise only as directed by your health care provider. Exercising will help you:  Control your weight.  Stay in shape.  Be prepared for labor and delivery.  Experiencing pain or cramping in the lower abdomen or low back is a good sign that you should stop exercising. Check with your health care provider before continuing normal exercises.  Try to avoid standing for long  periods of time. Move your legs often if you must stand in one place for a long time.  Avoid heavy lifting.  Wear low-heeled shoes, and practice good posture.  You may continue to have sex unless your health care provider directs you otherwise. Relief of Pain or Discomfort  Wear a good support bra for breast tenderness.   Take warm sitz baths to soothe any pain or discomfort caused by hemorrhoids. Use hemorrhoid cream if your health care provider approves.   Rest with your legs elevated if you have leg cramps or low back pain.  If you develop varicose veins in your legs, wear support hose. Elevate your feet for 15 minutes, 3-4 times a day. Limit salt in your diet. Prenatal Care  Schedule your prenatal visits by the twelfth week of pregnancy. They are usually scheduled monthly at first, then more often in the last 2 months before delivery.  Write down your questions. Take them to your prenatal visits.  Keep all your prenatal visits as directed by your   health care provider. Safety  Wear your seat belt at all times when driving.  Make a list of emergency phone numbers, including numbers for family, friends, the hospital, and police and fire departments. General Tips  Ask your health care provider for a referral to a local prenatal education class. Begin classes no later than at the beginning of month 6 of your pregnancy.  Ask for help if you have counseling or nutritional needs during pregnancy. Your health care provider can offer advice or refer you to specialists for help with various needs.  Do not use hot tubs, steam rooms, or saunas.  Do not douche or use tampons or scented sanitary pads.  Do not cross your legs for long periods of time.  Avoid cat litter boxes and soil used by cats. These carry germs that can cause birth defects in the baby and possibly loss of the fetus by miscarriage or stillbirth.  Avoid all smoking, herbs, alcohol, and medicines not prescribed by  your health care provider. Chemicals in these affect the formation and growth of the baby.  Do not use any tobacco products, including cigarettes, chewing tobacco, and electronic cigarettes. If you need help quitting, ask your health care provider. You may receive counseling support and other resources to help you quit.  Schedule a dentist appointment. At home, brush your teeth with a soft toothbrush and be gentle when you floss. SEEK MEDICAL CARE IF:   You have dizziness.  You have mild pelvic cramps, pelvic pressure, or nagging pain in the abdominal area.  You have persistent nausea, vomiting, or diarrhea.  You have a bad smelling vaginal discharge.  You have pain with urination.  You notice increased swelling in your face, hands, legs, or ankles. SEEK IMMEDIATE MEDICAL CARE IF:   You have a fever.  You are leaking fluid from your vagina.  You have spotting or bleeding from your vagina.  You have severe abdominal cramping or pain.  You have rapid weight gain or loss.  You vomit blood or material that looks like coffee grounds.  You are exposed to German measles and have never had them.  You are exposed to fifth disease or chickenpox.  You develop a severe headache.  You have shortness of breath.  You have any kind of trauma, such as from a fall or a car accident.   This information is not intended to replace advice given to you by your health care provider. Make sure you discuss any questions you have with your health care provider.   Document Released: 09/28/2001 Document Revised: 10/25/2014 Document Reviewed: 08/14/2013 Elsevier Interactive Patient Education 2016 [employer]  

## 2015-10-03 ENCOUNTER — Ambulatory Visit (INDEPENDENT_AMBULATORY_CARE_PROVIDER_SITE_OTHER): Payer: BC Managed Care – PPO

## 2015-10-03 DIAGNOSIS — N926 Irregular menstruation, unspecified: Secondary | ICD-10-CM

## 2015-10-10 ENCOUNTER — Ambulatory Visit (INDEPENDENT_AMBULATORY_CARE_PROVIDER_SITE_OTHER): Payer: BC Managed Care – PPO | Admitting: Obstetrics and Gynecology

## 2015-10-10 VITALS — BP 108/72 | HR 82 | Wt 148.6 lb

## 2015-10-10 DIAGNOSIS — O09291 Supervision of pregnancy with other poor reproductive or obstetric history, first trimester: Secondary | ICD-10-CM

## 2015-10-10 DIAGNOSIS — T7589XA Other specified effects of external causes, initial encounter: Secondary | ICD-10-CM

## 2015-10-10 DIAGNOSIS — Z3491 Encounter for supervision of normal pregnancy, unspecified, first trimester: Secondary | ICD-10-CM

## 2015-10-10 DIAGNOSIS — Z36 Encounter for antenatal screening of mother: Secondary | ICD-10-CM

## 2015-10-10 DIAGNOSIS — Z369 Encounter for antenatal screening, unspecified: Secondary | ICD-10-CM

## 2015-10-10 NOTE — Progress Notes (Signed)
Pt presents for NOB intake. Pt is G6 P1 041. Pt's history is significant for h/o miscarriage x 4. Pt states that there are cats in the home, however she is not changing the litter box. Informed  Pt of the risks of doing such. Pt denies smoking in the home. Pt does want NT genetic screening, will order. Pt c/o nausea today. Was previously given rx for zofran and phenergan by M. Shambley. Advised pt to take as needed. Pt is taking prenatal vitamin daily. Denies any spotting or cramping, advised to call if any miscarriage symptoms occur. Pt has had viability scan performed last week. Prenatal labs ordered. Pt desires to discuss VBAC at next visit with provider. Encouraged pt to sign up for myChart. Pt to f/u in 4 weeks for NT and NOB physical. All questions answered.

## 2015-10-10 NOTE — Patient Instructions (Signed)
Pt to f/u in 4wks for NT and NOB physical.

## 2015-10-11 LAB — CBC WITH DIFFERENTIAL/PLATELET
BASOS: 0 %
Basophils Absolute: 0 10*3/uL (ref 0.0–0.2)
EOS (ABSOLUTE): 0.1 10*3/uL (ref 0.0–0.4)
EOS: 1 %
HEMATOCRIT: 36.1 % (ref 34.0–46.6)
Hemoglobin: 11.8 g/dL (ref 11.1–15.9)
IMMATURE GRANS (ABS): 0 10*3/uL (ref 0.0–0.1)
Immature Granulocytes: 0 %
Lymphocytes Absolute: 1.6 10*3/uL (ref 0.7–3.1)
Lymphs: 28 %
MCH: 25.1 pg — AB (ref 26.6–33.0)
MCHC: 32.7 g/dL (ref 31.5–35.7)
MCV: 77 fL — AB (ref 79–97)
MONOS ABS: 0.3 10*3/uL (ref 0.1–0.9)
Monocytes: 5 %
NEUTROS ABS: 3.6 10*3/uL (ref 1.4–7.0)
Neutrophils: 66 %
PLATELETS: 196 10*3/uL (ref 150–379)
RBC: 4.71 x10E6/uL (ref 3.77–5.28)
RDW: 17.7 % — AB (ref 12.3–15.4)
WBC: 5.6 10*3/uL (ref 3.4–10.8)

## 2015-10-11 LAB — HEP, RPR, HIV PANEL
HIV Screen 4th Generation wRfx: NONREACTIVE
Hepatitis B Surface Ag: NEGATIVE
RPR Ser Ql: NONREACTIVE

## 2015-10-11 LAB — URINALYSIS, ROUTINE W REFLEX MICROSCOPIC
BILIRUBIN UA: NEGATIVE
Glucose, UA: NEGATIVE
KETONES UA: NEGATIVE
Leukocytes, UA: NEGATIVE
Nitrite, UA: NEGATIVE
PH UA: 6 (ref 5.0–7.5)
PROTEIN UA: NEGATIVE
RBC UA: NEGATIVE
SPEC GRAV UA: 1.027 (ref 1.005–1.030)
UUROB: 0.2 mg/dL (ref 0.2–1.0)

## 2015-10-11 LAB — ANTIBODY SCREEN: Antibody Screen: NEGATIVE

## 2015-10-11 LAB — RUBELLA SCREEN: Rubella Antibodies, IGG: 7.47 index (ref >0.99)

## 2015-10-11 LAB — TOXOPLASMA ANTIBODIES- IGG AND  IGM: Toxoplasma Antibody- IgM: <3 AU/mL (ref 0.0–7.9)

## 2015-10-11 LAB — ABO AND RH: RH TYPE: POSITIVE

## 2015-10-11 LAB — VARICELLA ZOSTER ANTIBODY, IGG: VARICELLA: 1189 {index} (ref >165)

## 2015-10-12 LAB — URINE CULTURE: ORGANISM ID, BACTERIA: NO GROWTH

## 2015-10-15 ENCOUNTER — Ambulatory Visit (INDEPENDENT_AMBULATORY_CARE_PROVIDER_SITE_OTHER): Payer: BC Managed Care – PPO | Admitting: Obstetrics and Gynecology

## 2015-10-15 VITALS — BP 104/71 | HR 85 | Wt 147.6 lb

## 2015-10-15 DIAGNOSIS — Z1379 Encounter for other screening for genetic and chromosomal anomalies: Secondary | ICD-10-CM | POA: Diagnosis not present

## 2015-10-15 DIAGNOSIS — O09291 Supervision of pregnancy with other poor reproductive or obstetric history, first trimester: Secondary | ICD-10-CM

## 2015-10-15 DIAGNOSIS — O200 Threatened abortion: Secondary | ICD-10-CM

## 2015-10-15 DIAGNOSIS — O43891 Other placental disorders, first trimester: Secondary | ICD-10-CM

## 2015-10-15 DIAGNOSIS — O468X1 Other antepartum hemorrhage, first trimester: Secondary | ICD-10-CM

## 2015-10-15 DIAGNOSIS — O418X10 Other specified disorders of amniotic fluid and membranes, first trimester, not applicable or unspecified: Secondary | ICD-10-CM

## 2015-10-15 LAB — US OP OB COMP LESS 14 WKS

## 2015-10-15 NOTE — Progress Notes (Signed)
    GYNECOLOGY CLINIC PROGRESS NOTE  Subjective:    Jenny Delgado is a 24 y.o. [phone] female.  She is at 3185w1d gestation, with Estimated Date of Delivery: 05/18/16.  Jenny Delgado reports bleeding 2 days ago x 1 episode, filling 1/3 of a pad.  Was seen in the Emergency Room, where she was diagnosed with a small subchorionic hemorrhage and bacterial vaginosis . Was instructed to f/u with OB in 2-3 days. She is not in acute distress. Today denies any further bleeding, or cramping. Notes compliance with antibiotics prescribed for BV.  Does note some nausea associated with pregnancy and medication.  Pregnancy imaging: transvaginal ultrasound done on 09/13/2015. Result SIUP at 8.5 weeks by CRL, with EDD 05/18/2016, FHR 168 bpm,  and normal left adnexa (right not seen). Tiny amount of fluid in cervical canal, tiny subchorionic hemorrhage.  Blood type: A positive.  Other lab results: bHCG levels 94691 on 10/13/15.  The following portions of the patient's history were reviewed and updated as appropriate:  She  has a past medical history of Anxiety; Depression; Headache(784.0); and IUD contraception. She  does not have any pertinent problems on file. She  has past surgical history that includes Wisdom tooth extraction and Cesarean section (2008). Her family history includes Diabetes in her maternal grandfather; Hypertension in her father. There is no history of Breast cancer, Cervical cancer, Colon cancer, Ovarian cancer, or Heart disease. She  reports that she has never smoked. She has never used smokeless tobacco. She reports that she drinks alcohol. She reports that she does not use illicit drugs. She has a current medication list which includes the following prescription(s): ondansetron, prenatal multivitamin, and promethazine. Current Outpatient Prescriptions on File Prior to Visit  Medication Sig Dispense Refill  . ondansetron (ZOFRAN ODT) 4 MG disintegrating tablet Take 1 tablet (4 mg total) by mouth  every 6 (six) hours as needed for nausea. 20 tablet 0  . Prenatal Vit-Fe Fumarate-FA (PRENATAL MULTIVITAMIN) TABS tablet Take 1 tablet by mouth daily at 12 noon.    . promethazine (PHENERGAN) 25 MG tablet Take 1 tablet (25 mg total) by mouth every 6 (six) hours as needed for nausea or vomiting. 30 tablet 2   No current facility-administered medications on file prior to visit.  .  Review of Systems Pertinent items noted in HPI and remainder of comprehensive ROS otherwise negative.   Objective:     BP 104/71 mmHg  Pulse 85  Wt 147 lb 9.6 oz (66.951 kg)  LMP 08/12/2015 (Exact Date) General:   alert and no distress  Abdomen: soft, non-tender, without masses or organomegaly  Pelvic: Exam deferred.     Assessment:    Bleeding in early pregnancy secondary to small subchorionic hemorrhage. Threatened abortion IUP at 9.[redacted] weeks gestation   H/o multiple miscarriages  Plan:   Discussed diagnosis in detail. Given reassurance.   Follow-up appointment with MD on 11/05/2015 for NOB physical.  Also for 1st trimester screen at that time (notes difficulty with work schedule so would like to have performed at same visit if possible). Warning signs discussed: to call for increased bleeding, abdominal or shoulder pain, light headedness, or if she has any concerns.     Hildred LaserAnika Baldo Hufnagle, MD [facility]

## 2015-10-19 NOTE — L&D Delivery Note (Signed)
Delivery Summary for [facility]  Labor Events:   Preterm labor:   Rupture date:   Rupture time:   Rupture type:   Fluid Color:   Induction:   Augmentation:   Complications:   Cervical ripening:          Delivery:   Episiotomy:   Lacerations:   Repair suture:   Repair # of packets:   Blood loss (ml): 600   Information for the patient's newborn:  Mirasol, Butera [[id number]]    Delivery 05/11/2016 3:07 PM by  C-Section, Low Transverse Sex:  female Gestational Age: [redacted]w[redacted]d Delivery Clinician:   Living?: Yes        APGARS  One minute Five minutes Ten minutes  Skin color:        Heart rate:        Grimace:        Muscle tone:        Breathing:        Totals: 8  9      Presentation/position:      Resuscitation:   Cord information:    Disposition of cord blood:     Blood gases sent?  Complications:   Placenta: Delivered:       appearance Newborn Measurements: Weight: 8 lb 9.9 oz (3910 g)  Height: 19.49"  Head circumference:    Chest circumference:    Other providers:    Additional  information: Forceps:   Vacuum:   Breech:   Observed anomalies       See Dr. Oretha Milch operative note for details of C-section.    Hildred Laser, MD [facility]

## 2015-11-05 ENCOUNTER — Other Ambulatory Visit: Payer: Self-pay | Admitting: Obstetrics and Gynecology

## 2015-11-05 ENCOUNTER — Ambulatory Visit (INDEPENDENT_AMBULATORY_CARE_PROVIDER_SITE_OTHER): Payer: BC Managed Care – PPO

## 2015-11-05 ENCOUNTER — Ambulatory Visit (INDEPENDENT_AMBULATORY_CARE_PROVIDER_SITE_OTHER): Payer: BC Managed Care – PPO | Admitting: Obstetrics and Gynecology

## 2015-11-05 ENCOUNTER — Encounter: Payer: BC Managed Care – PPO | Admitting: Obstetrics and Gynecology

## 2015-11-05 VITALS — BP 101/62 | HR 94 | Wt 150.7 lb

## 2015-11-05 DIAGNOSIS — O99611 Diseases of the digestive system complicating pregnancy, first trimester: Secondary | ICD-10-CM

## 2015-11-05 DIAGNOSIS — Z1379 Encounter for other screening for genetic and chromosomal anomalies: Secondary | ICD-10-CM

## 2015-11-05 DIAGNOSIS — K5900 Constipation, unspecified: Secondary | ICD-10-CM

## 2015-11-05 DIAGNOSIS — O219 Vomiting of pregnancy, unspecified: Secondary | ICD-10-CM

## 2015-11-05 DIAGNOSIS — Z3482 Encounter for supervision of other normal pregnancy, second trimester: Secondary | ICD-10-CM

## 2015-11-05 DIAGNOSIS — O2620 Pregnancy care for patient with recurrent pregnancy loss, unspecified trimester: Secondary | ICD-10-CM

## 2015-11-05 DIAGNOSIS — Z8742 Personal history of other diseases of the female genital tract: Secondary | ICD-10-CM

## 2015-11-05 DIAGNOSIS — N96 Recurrent pregnancy loss: Secondary | ICD-10-CM | POA: Insufficient documentation

## 2015-11-05 DIAGNOSIS — Z3492 Encounter for supervision of normal pregnancy, unspecified, second trimester: Secondary | ICD-10-CM

## 2015-11-05 LAB — POCT URINALYSIS DIPSTICK
Bilirubin, UA: NEGATIVE
Glucose, UA: NEGATIVE
Ketones, UA: NEGATIVE
LEUKOCYTES UA: NEGATIVE
NITRITE UA: NEGATIVE
PH UA: 7.5
PROTEIN UA: NEGATIVE
Spec Grav, UA: 1.01
Urobilinogen, UA: NEGATIVE

## 2015-11-05 MED ORDER — DOCUSATE SODIUM 100 MG PO CAPS: 100.0000 mg | ORAL_CAPSULE | Freq: Two times a day (BID) | ORAL | Status: DC | PRN

## 2015-11-05 MED ORDER — ASPIRIN EC 81 MG PO TBEC: 81.0000 mg | DELAYED_RELEASE_TABLET | Freq: Every day | ORAL | Status: DC

## 2015-11-05 MED ORDER — DOXYLAMINE-PYRIDOXINE 10-10 MG PO TBEC: 2.0000 | DELAYED_RELEASE_TABLET | Freq: Every day | ORAL | Status: DC

## 2015-11-05 NOTE — Progress Notes (Signed)
OBSTETRIC INITIAL PRENATAL CARE CLINIC VISIT  Subjective:    Jenny Delgado is being seen today for her first obstetrical visit.  This is a planned pregnancy. She is a 25 y.o. [id number] at [redacted]w[redacted]d gestation, Estimated Date of Delivery: 05/18/16 by patient's last menstrual period was 08/12/2015 (exact date), consistent with 8 week sono. Her obstetrical history is significant for h/o prior C-section, recurrent miscarriages, and small subchorionic hemorrhage in current pregnancy. Relationship with FOB: spouse, living together. Patient does intend to breast feed. Pregnancy history fully reviewed.  Menstrual History: Obstetric History   G6   P1   T1   P0   A4   TAB0   SAB3   E0   M0   L1     # Outcome Date GA Lbr Len/2nd Weight Sex Delivery Anes PTL Lv  6 Current           5 SAB 05/2015        FD  4 Term 09/20/07 [redacted]w[redacted]d  7 lb 8 oz (3.402 kg) M CS-LTranv EPI  Y     Name: Harrold Donath  3 AB           2 SAB           1 SAB               Menarche age: 80  Patient's last menstrual period was 08/12/2015 (exact date).  Denies h/o of STIs.  H/o abnormal pap smear in 2014 (cannot recall exact results). Notes last pap smear 02/2015, normal.   Past Medical History  Diagnosis Date  . Anxiety   . Depression   . Headache(784.0)   . IUD contraception     mirena    Past Surgical History  Procedure Laterality Date  . Wisdom tooth extraction    . Cesarean section  2008   Family History  Problem Relation Age of Onset  . Hypertension Father   . Diabetes Maternal Grandfather   . Breast cancer Neg Hx   . Cervical cancer Neg Hx   . Colon cancer Neg Hx   . Ovarian cancer Neg Hx   . Heart disease Neg Hx    Social History   Social History  . Marital Status: Married    Spouse Name: N/A  . Number of Children: N/A  . Years of Education: N/A   Occupational History  . Not on file.   Social History Main Topics  . Smoking status: Never Smoker   . Smokeless tobacco: Never Used  . Alcohol Use: Yes     Comment: ocassional wine coolers   . Drug Use: No  . Sexual Activity: Yes    Birth Control/ Protection: None     Comment: Pregnant    Other Topics Concern  . Not on file   Social History Narrative   Current Outpatient Prescriptions on File Prior to Visit  Medication Sig Dispense Refill  . ondansetron (ZOFRAN ODT) 4 MG disintegrating tablet Take 1 tablet (4 mg total) by mouth every 6 (six) hours as needed for nausea. 20 tablet 0  . Prenatal Vit-Fe Fumarate-FA (PRENATAL MULTIVITAMIN) TABS tablet Take 1 tablet by mouth daily at 12 noon.     No current facility-administered medications on file prior to visit.   Allergies  Allergen Reactions  . Bactrim [Sulfamethoxazole-Trimethoprim]   . Escitalopram     Other reaction(s): Other (See Comments) Facial numbness  . Sertraline Other (See Comments)    Facial numbness  . Lexapro [Escitalopram Oxalate] Rash  Facial numbness  . Zoloft [Sertraline Hcl] Rash    Facial numbness     Review of Systems General:Not Present- Fever, Weight Loss and Weight Gain. Skin:Not Present- Rash. HEENT:Not Present- Blurred Vision, Headache and Bleeding Gums. Respiratory:Not Present- Difficulty Breathing. Breast:Present - Breast tenderness. Not Present- Breast Mass. Cardiovascular:Not Present- Chest Pain, Elevated Blood Pressure, Fainting / Blacking Out and Shortness of Breath. Gastrointestinal:Present - Constipation, Nausea and Vomiting (however mostly nausea). Not Present- Abdominal Pain, diarrhea.  Female Genitourinary:Not Present- Frequency, Painful Urination, Pelvic Pain, Vaginal Bleeding, Vaginal Discharge, Contractions, regular, Fetal Movements Decreased, Urinary Complaints and Vaginal Fluid. Musculoskeletal:Not Present- Back Pain and Leg Cramps. Neurological:Not Present- Dizziness. Psychiatric:Not Present- Depression.    Objective:    BP 101/62 mmHg  Pulse 94  Wt 150 lb 11.2 oz (68.357 kg)  LMP 08/12/2015 (Exact Date)      General Appearance:    Alert, cooperative, no distress, appears stated age  Head:    Normocephalic, without obvious abnormality, atraumatic  Eyes:    PERRL, conjunctiva/corneas clear, EOM's intact, both eyes  Ears:    Normal external ear canals, both ears  Nose:   Nares normal, septum midline, mucosa normal, no drainage or sinus tenderness  Throat:   Lips, mucosa, and tongue normal; teeth and gums normal  Neck:   Supple, symmetrical, trachea midline, no adenopathy; thyroid: no enlargement/tenderness/nodules; no carotid bruit or JVD  Back:     Symmetric, no curvature, ROM normal, no CVA tenderness  Lungs:     Clear to auscultation bilaterally, respirations unlabored  Chest Wall:    No tenderness or deformity   Heart:    Regular rate and rhythm, S1 and S2 normal, no murmur, rub or gallop  Breast Exam:    No tenderness, masses, or nipple abnormality  Abdomen:     Soft, non-tender, bowel sounds active all four quadrants, no masses, no organomegaly.  FH 13.  FHT 154  Bpm.  Well healed Pfannenstiel incision.   Genitalia:    Pelvic:external genitalia normal, vagina without lesions, discharge, or tenderness, rectovaginal septum  normal. Cervix normal in appearance, no cervical motion tenderness, no adnexal masses or tenderness.  Pregnancy positive findings: uterine enlargement: 12-14 wk size, nontender.   Rectal:    Normal external sphincter.  No hemorrhoids appreciated. Internal exam not done.   Extremities:   Extremities normal, atraumatic, no cyanosis or edema  Pulses:   2+ and symmetric all extremities  Skin:   Skin color, texture, turgor normal, no rashes or lesions  Lymph nodes:   Cervical, supraclavicular, and axillary nodes normal  Neurologic:   CNII-XII intact, normal strength, sensation and reflexes throughout    Assessment:   Pregnancy at 12 and 1/7 weeks   H/o prior C-section x 1 Constipation Nausea and vomiting of pregnancy H/o recurrent miscarriages Small subchorionic  hemorrhage H/o abnormal pap smear in 2014  Plan:    Initial labs reviewed. Prenatal vitamins encouraged. Problem list reviewed and updated. New OB counseling: The patient has been given an overview regarding routine prenatal care. Recommendations regarding diet, weight gain, and exercise in pregnancy were given. Prenatal testing, optional genetic testing, and ultrasound use in pregnancy were reviewed.  1st trimester screen discussed: ordered, performed today. Benefits of Breast Feeding were discussed. The patient is encouraged to consider nursing her baby post partum. H/o prior C-section discussed.  Patient unsure whether or not she would desire repeat C-section vs TOLAC, but leaning more toward TOLAC.  Discussed risks vs benefits.  To discuss further at  future visits.  Continue Zofran for nausea/vomiting as needed.  Did not like side effects of Phenergan so discontinued.  Patient notes mostly nausea now. Will try Diclegis, and back up with Zofran as needed.  Recommended taking daily baby aspirin for h/o recurrent miscarriages.  No further vaginal bleeding since last visit.  Likely resolution of small subchorionic hemorrhage.  Will f/u on today's sono.  Counseled on adequate water and fiber intake, also will prescribe Colace for constipation.  Will need repeat pap smear postpartum.  Follow up in 4 weeks. 50% of 30 min visit spent on counseling and coordination of care.     Hildred Laser, MD [facility]

## 2015-11-08 LAB — FIRST TRIMESTER SCREEN W/NT
CRL: 55.7 mm
DIA MoM: 1.25
DIA VALUE: 317 pg/mL
Gest Age-Collect: 12.1 weeks
HCG VALUE: 126.6 [IU]/mL
Maternal Age At EDD: 26.9 years
Nuchal Translucency MoM: 1.11
Nuchal Translucency: 1.3 mm
Number of Fetuses: 1
PAPP-A MOM: 0.89
PAPP-A Value: 756.2 ng/mL
PDF: 0
TEST RESULTS: NEGATIVE
WEIGHT: 150 [lb_av]
hCG MoM: 1.27

## 2015-11-12 ENCOUNTER — Telehealth: Payer: Self-pay

## 2015-11-12 NOTE — Telephone Encounter (Signed)
Pt informed

## 2015-11-12 NOTE — Telephone Encounter (Signed)
-----   Message from Hildred Laser, MD sent at 11/11/2015  5:18 PM EST ----- Please inform of negative 1st trimester screen

## 2015-11-24 ENCOUNTER — Telehealth: Payer: Self-pay

## 2015-11-24 NOTE — Telephone Encounter (Signed)
Possible y/i- really bad itching. Slight d/c. Sx x 2 days.- No new detergents or soaps. Pt aware she may use Monistat OTC 7 day. IF still having sx. She will need to be seen on day 9.

## 2015-12-03 ENCOUNTER — Other Ambulatory Visit: Payer: Self-pay | Admitting: Obstetrics and Gynecology

## 2015-12-03 ENCOUNTER — Ambulatory Visit (INDEPENDENT_AMBULATORY_CARE_PROVIDER_SITE_OTHER): Payer: BC Managed Care – PPO | Admitting: Obstetrics and Gynecology

## 2015-12-03 VITALS — BP 105/66 | HR 123 | Wt 151.6 lb

## 2015-12-03 DIAGNOSIS — O2620 Pregnancy care for patient with recurrent pregnancy loss, unspecified trimester: Secondary | ICD-10-CM

## 2015-12-03 DIAGNOSIS — O34219 Maternal care for unspecified type scar from previous cesarean delivery: Secondary | ICD-10-CM

## 2015-12-03 DIAGNOSIS — Z3492 Encounter for supervision of normal pregnancy, unspecified, second trimester: Secondary | ICD-10-CM

## 2015-12-03 DIAGNOSIS — Z3493 Encounter for supervision of normal pregnancy, unspecified, third trimester: Secondary | ICD-10-CM | POA: Insufficient documentation

## 2015-12-03 DIAGNOSIS — Z3482 Encounter for supervision of other normal pregnancy, second trimester: Secondary | ICD-10-CM

## 2015-12-03 LAB — POCT URINALYSIS DIPSTICK
BILIRUBIN UA: NEGATIVE
Glucose, UA: NEGATIVE
Ketones, UA: NEGATIVE
Leukocytes, UA: NEGATIVE
NITRITE UA: NEGATIVE
PH UA: 7
Protein, UA: NEGATIVE
Spec Grav, UA: 1.01
UROBILINOGEN UA: NEGATIVE

## 2015-12-03 NOTE — Progress Notes (Signed)
ROB: Patient doing well, denies complaints.  Notes recently treating vaginal yeast infection with Monistat 1-2 weeks ago.  Notes symptoms have resolved. Discussed TOLAC vs RLTCS again, patient desires TOLAC.  Discussed risks ( including uterine rupture of 1%) vs benefits. Normal 1st trimester screen.  For MSAFP today. RTC in 4 weeks.  For anatomy scan at that time.

## 2015-12-09 LAB — AFP, SERUM, OPEN SPINA BIFIDA
AFP MoM: 1.02
AFP VALUE AFPOSL: 33 ng/mL
GEST. AGE ON COLLECTION DATE: 16.1 wk
Maternal Age At EDD: 26.9 years
OSBR Risk 1 IN: 10000
PDF: 0
TEST RESULTS AFP: NEGATIVE
Weight: 151 [lb_av]

## 2015-12-11 ENCOUNTER — Telehealth: Payer: Self-pay

## 2015-12-11 NOTE — Telephone Encounter (Signed)
Called pt LM informing pt that AFP was normal.

## 2015-12-11 NOTE — Telephone Encounter (Signed)
-----   Message from Hildred Laser, MD sent at 12/10/2015  5:29 PM EST ----- Please inform patient that msAFP was normal.

## 2015-12-30 ENCOUNTER — Ambulatory Visit (INDEPENDENT_AMBULATORY_CARE_PROVIDER_SITE_OTHER): Payer: BC Managed Care – PPO

## 2015-12-30 ENCOUNTER — Ambulatory Visit (INDEPENDENT_AMBULATORY_CARE_PROVIDER_SITE_OTHER): Payer: BC Managed Care – PPO | Admitting: Obstetrics and Gynecology

## 2015-12-30 VITALS — BP 112/69 | HR 106 | Wt 157.4 lb

## 2015-12-30 DIAGNOSIS — K219 Gastro-esophageal reflux disease without esophagitis: Secondary | ICD-10-CM

## 2015-12-30 DIAGNOSIS — Z3482 Encounter for supervision of other normal pregnancy, second trimester: Secondary | ICD-10-CM | POA: Diagnosis not present

## 2015-12-30 DIAGNOSIS — Z3492 Encounter for supervision of normal pregnancy, unspecified, second trimester: Secondary | ICD-10-CM

## 2015-12-30 DIAGNOSIS — O99613 Diseases of the digestive system complicating pregnancy, third trimester: Secondary | ICD-10-CM

## 2015-12-30 DIAGNOSIS — O99612 Diseases of the digestive system complicating pregnancy, second trimester: Secondary | ICD-10-CM

## 2015-12-30 MED ORDER — RANITIDINE HCL 150 MG PO TABS: 150.0000 mg | ORAL_TABLET | Freq: Two times a day (BID) | ORAL | Status: DC

## 2015-12-30 NOTE — Progress Notes (Signed)
ROB: Patient c/o heartburn, not relieved by Tums.  Prescribed Zantac.  Normal AFP.  S/p normal, but incomplete anatomy scan (female infant).  For repeat scan in 2 weeks. RTC in 4 weeks.

## 2016-01-13 ENCOUNTER — Ambulatory Visit (INDEPENDENT_AMBULATORY_CARE_PROVIDER_SITE_OTHER): Payer: BC Managed Care – PPO

## 2016-01-13 DIAGNOSIS — Z3492 Encounter for supervision of normal pregnancy, unspecified, second trimester: Secondary | ICD-10-CM

## 2016-01-13 DIAGNOSIS — Z3482 Encounter for supervision of other normal pregnancy, second trimester: Secondary | ICD-10-CM

## 2016-01-27 ENCOUNTER — Ambulatory Visit (INDEPENDENT_AMBULATORY_CARE_PROVIDER_SITE_OTHER): Payer: BC Managed Care – PPO | Admitting: Obstetrics and Gynecology

## 2016-01-27 VITALS — BP 101/68 | HR 95 | Wt 166.4 lb

## 2016-01-27 DIAGNOSIS — Z131 Encounter for screening for diabetes mellitus: Secondary | ICD-10-CM

## 2016-01-27 DIAGNOSIS — Z3482 Encounter for supervision of other normal pregnancy, second trimester: Secondary | ICD-10-CM

## 2016-01-27 DIAGNOSIS — Z3492 Encounter for supervision of normal pregnancy, unspecified, second trimester: Secondary | ICD-10-CM

## 2016-01-27 LAB — POCT URINALYSIS DIPSTICK
BILIRUBIN UA: NEGATIVE
Glucose, UA: NEGATIVE
KETONES UA: NEGATIVE
LEUKOCYTES UA: NEGATIVE
Nitrite, UA: NEGATIVE
PH UA: 7.5
PROTEIN UA: NEGATIVE
SPEC GRAV UA: 1.01
Urobilinogen, UA: NEGATIVE

## 2016-01-27 NOTE — Progress Notes (Signed)
ROB: Nausea has improved.  Also notes heartburn has improved with Zantac.  Cautioned on excessive weight gain (10 lbs since last visit).  RTC in 4 weeks. Will need 28 week labs next visit.

## 2016-02-26 ENCOUNTER — Other Ambulatory Visit: Payer: BC Managed Care – PPO

## 2016-02-26 ENCOUNTER — Ambulatory Visit (INDEPENDENT_AMBULATORY_CARE_PROVIDER_SITE_OTHER): Payer: BC Managed Care – PPO | Admitting: Obstetrics and Gynecology

## 2016-02-26 ENCOUNTER — Other Ambulatory Visit: Payer: Self-pay

## 2016-02-26 VITALS — BP 96/63 | HR 87 | Wt 171.1 lb

## 2016-02-26 DIAGNOSIS — Z23 Encounter for immunization: Secondary | ICD-10-CM | POA: Diagnosis not present

## 2016-02-26 DIAGNOSIS — Z3493 Encounter for supervision of normal pregnancy, unspecified, third trimester: Secondary | ICD-10-CM

## 2016-02-26 DIAGNOSIS — Z3492 Encounter for supervision of normal pregnancy, unspecified, second trimester: Secondary | ICD-10-CM

## 2016-02-26 DIAGNOSIS — Z3483 Encounter for supervision of other normal pregnancy, third trimester: Secondary | ICD-10-CM | POA: Diagnosis not present

## 2016-02-26 DIAGNOSIS — Z131 Encounter for screening for diabetes mellitus: Secondary | ICD-10-CM

## 2016-02-26 DIAGNOSIS — O2620 Pregnancy care for patient with recurrent pregnancy loss, unspecified trimester: Secondary | ICD-10-CM

## 2016-02-26 LAB — POCT URINALYSIS DIPSTICK
Bilirubin, UA: NEGATIVE
Glucose, UA: NEGATIVE
KETONES UA: NEGATIVE
NITRITE UA: NEGATIVE
PH UA: 7.5
PROTEIN UA: NEGATIVE
Spec Grav, UA: 1.01
UROBILINOGEN UA: NEGATIVE

## 2016-02-26 MED ORDER — TETANUS-DIPHTH-ACELL PERTUSSIS 5-2.5-18.5 LF-MCG/0.5 IM SUSP
0.5000 mL | Freq: Once | INTRAMUSCULAR | Status: AC
  Administered 2016-02-26: 0.5 mL via INTRAMUSCULAR

## 2016-02-26 NOTE — Progress Notes (Signed)
ROB: Doing well, no complaints.  For 28 week labs.  Discussed cord blood banking, signed blood consent form.  Tdap given today.  Desires to breastfeed, Mirena IUD for contraception. Was considering TOLAC, but now desires repeat C-section.  Will schedule. RTC in 2 weeks.

## 2016-02-27 LAB — GLUCOSE, 1 HOUR GESTATIONAL: GESTATIONAL DIABETES SCREEN: 138 mg/dL (ref 65–139)

## 2016-02-27 LAB — HEMOGLOBIN AND HEMATOCRIT, BLOOD
HEMATOCRIT: 33.9 % — AB (ref 34.0–46.6)
Hemoglobin: 11.1 g/dL (ref 11.1–15.9)

## 2016-03-01 ENCOUNTER — Telehealth: Payer: Self-pay

## 2016-03-01 NOTE — Telephone Encounter (Signed)
-----   Message from Hildred LaserAnika Cherry, MD sent at 02/28/2016 11:46 AM EDT ----- Can inform of normal glucola.

## 2016-03-01 NOTE — Telephone Encounter (Signed)
Pt informed

## 2016-03-16 ENCOUNTER — Ambulatory Visit (INDEPENDENT_AMBULATORY_CARE_PROVIDER_SITE_OTHER): Payer: BC Managed Care – PPO | Admitting: Obstetrics and Gynecology

## 2016-03-16 ENCOUNTER — Encounter: Payer: Self-pay | Admitting: Obstetrics and Gynecology

## 2016-03-16 VITALS — BP 103/68 | HR 108 | Wt 176.1 lb

## 2016-03-16 DIAGNOSIS — O34219 Maternal care for unspecified type scar from previous cesarean delivery: Secondary | ICD-10-CM

## 2016-03-16 DIAGNOSIS — Z3493 Encounter for supervision of normal pregnancy, unspecified, third trimester: Secondary | ICD-10-CM

## 2016-03-16 DIAGNOSIS — Z3483 Encounter for supervision of other normal pregnancy, third trimester: Secondary | ICD-10-CM

## 2016-03-16 DIAGNOSIS — B373 Candidiasis of vulva and vagina: Secondary | ICD-10-CM

## 2016-03-16 DIAGNOSIS — B3731 Acute candidiasis of vulva and vagina: Secondary | ICD-10-CM

## 2016-03-16 LAB — POCT URINALYSIS DIPSTICK
Bilirubin, UA: NEGATIVE
Glucose, UA: NEGATIVE
Ketones, UA: NEGATIVE
LEUKOCYTES UA: NEGATIVE
NITRITE UA: NEGATIVE
PH UA: 6.5
Spec Grav, UA: 1.02
UROBILINOGEN UA: NEGATIVE

## 2016-03-16 NOTE — Progress Notes (Signed)
ROB: Patient doing well. Notes complaints of yeast infection, is taking Monistat 7. RTC in 2 weeks.

## 2016-03-30 ENCOUNTER — Ambulatory Visit (INDEPENDENT_AMBULATORY_CARE_PROVIDER_SITE_OTHER): Payer: BC Managed Care – PPO | Admitting: Obstetrics and Gynecology

## 2016-03-30 VITALS — BP 99/63 | HR 91 | Wt 176.2 lb

## 2016-03-30 DIAGNOSIS — O34219 Maternal care for unspecified type scar from previous cesarean delivery: Secondary | ICD-10-CM

## 2016-03-30 DIAGNOSIS — Z3493 Encounter for supervision of normal pregnancy, unspecified, third trimester: Secondary | ICD-10-CM

## 2016-03-30 DIAGNOSIS — Z3483 Encounter for supervision of other normal pregnancy, third trimester: Secondary | ICD-10-CM

## 2016-03-30 LAB — POCT URINALYSIS DIPSTICK
BILIRUBIN UA: NEGATIVE
GLUCOSE UA: NEGATIVE
KETONES UA: NEGATIVE
Nitrite, UA: NEGATIVE
PH UA: 6.5
SPEC GRAV UA: 1.02
Urobilinogen, UA: NEGATIVE

## 2016-03-30 NOTE — Progress Notes (Signed)
ROB: notes fatigue.  To schedule repeat C-section next visit. RTC in 2 weeks.

## 2016-04-02 ENCOUNTER — Telehealth: Payer: Self-pay | Admitting: Obstetrics and Gynecology

## 2016-04-02 NOTE — Telephone Encounter (Signed)
Called pt no answer, LM for pt advising her that it is ok to use OTC foot creams in pregnancy as it is not absorbed like an oral medication.

## 2016-04-02 NOTE — Telephone Encounter (Signed)
Patient called stating she has a toenail fungal infection and wanted to know what cream is safe to take being pregnant.Thanks

## 2016-04-13 ENCOUNTER — Ambulatory Visit (INDEPENDENT_AMBULATORY_CARE_PROVIDER_SITE_OTHER): Payer: BC Managed Care – PPO | Admitting: Obstetrics and Gynecology

## 2016-04-13 VITALS — BP 109/72 | HR 111 | Wt 177.9 lb

## 2016-04-13 DIAGNOSIS — O34219 Maternal care for unspecified type scar from previous cesarean delivery: Secondary | ICD-10-CM

## 2016-04-13 DIAGNOSIS — O2620 Pregnancy care for patient with recurrent pregnancy loss, unspecified trimester: Secondary | ICD-10-CM

## 2016-04-13 DIAGNOSIS — Z3685 Encounter for antenatal screening for Streptococcus B: Secondary | ICD-10-CM

## 2016-04-13 DIAGNOSIS — Z36 Encounter for antenatal screening of mother: Secondary | ICD-10-CM

## 2016-04-13 DIAGNOSIS — Z3493 Encounter for supervision of normal pregnancy, unspecified, third trimester: Secondary | ICD-10-CM

## 2016-04-13 DIAGNOSIS — Z113 Encounter for screening for infections with a predominantly sexual mode of transmission: Secondary | ICD-10-CM

## 2016-04-13 DIAGNOSIS — Z3483 Encounter for supervision of other normal pregnancy, third trimester: Secondary | ICD-10-CM

## 2016-04-13 LAB — POCT URINALYSIS DIPSTICK
Bilirubin, UA: NEGATIVE
Glucose, UA: NEGATIVE
KETONES UA: NEGATIVE
Nitrite, UA: NEGATIVE
PH UA: 7
PROTEIN UA: NEGATIVE
SPEC GRAV UA: 1.02
UROBILINOGEN UA: NEGATIVE

## 2016-04-13 NOTE — Progress Notes (Signed)
ROB: Patient has difficulty lifting at work, given notice for work restrictions.  Scheduled repeat C-section on 05/11/16. 36 week labs done. RTC in 1 week.

## 2016-04-14 LAB — GC/CHLAMYDIA PROBE AMP
Chlamydia trachomatis, NAA: NEGATIVE
Neisseria gonorrhoeae by PCR: NEGATIVE

## 2016-04-16 LAB — CULTURE, BETA STREP (GROUP B ONLY): Strep Gp B Culture: NEGATIVE

## 2016-04-21 ENCOUNTER — Ambulatory Visit (INDEPENDENT_AMBULATORY_CARE_PROVIDER_SITE_OTHER): Payer: BC Managed Care – PPO | Admitting: Obstetrics and Gynecology

## 2016-04-21 ENCOUNTER — Encounter: Payer: Self-pay | Admitting: Obstetrics and Gynecology

## 2016-04-21 VITALS — BP 113/74 | HR 86 | Wt 179.3 lb

## 2016-04-21 DIAGNOSIS — O99613 Diseases of the digestive system complicating pregnancy, third trimester: Secondary | ICD-10-CM

## 2016-04-21 DIAGNOSIS — Z1389 Encounter for screening for other disorder: Secondary | ICD-10-CM

## 2016-04-21 DIAGNOSIS — Z36 Encounter for antenatal screening of mother: Secondary | ICD-10-CM

## 2016-04-21 DIAGNOSIS — O34219 Maternal care for unspecified type scar from previous cesarean delivery: Secondary | ICD-10-CM

## 2016-04-21 DIAGNOSIS — Z369 Encounter for antenatal screening, unspecified: Secondary | ICD-10-CM

## 2016-04-21 DIAGNOSIS — K219 Gastro-esophageal reflux disease without esophagitis: Secondary | ICD-10-CM

## 2016-04-21 DIAGNOSIS — Z3493 Encounter for supervision of normal pregnancy, unspecified, third trimester: Secondary | ICD-10-CM

## 2016-04-21 DIAGNOSIS — Z3483 Encounter for supervision of other normal pregnancy, third trimester: Secondary | ICD-10-CM

## 2016-04-21 LAB — POCT URINALYSIS DIPSTICK
Bilirubin, UA: NEGATIVE
Blood, UA: NEGATIVE
GLUCOSE UA: NEGATIVE
Ketones, UA: NEGATIVE
NITRITE UA: NEGATIVE
Protein, UA: NEGATIVE
SPEC GRAV UA: 1.01
UROBILINOGEN UA: NEGATIVE
pH, UA: 6

## 2016-04-21 NOTE — Progress Notes (Signed)
ROB: Patient notes Zantac no longer helping, will prescribe Protonix. 36 labs negative. PTL precautions discussed. RTC in 1 week.

## 2016-04-28 ENCOUNTER — Ambulatory Visit (INDEPENDENT_AMBULATORY_CARE_PROVIDER_SITE_OTHER): Payer: BC Managed Care – PPO | Admitting: Obstetrics and Gynecology

## 2016-04-28 ENCOUNTER — Encounter: Payer: Self-pay | Admitting: Obstetrics and Gynecology

## 2016-04-28 VITALS — BP 108/65 | HR 92 | Wt 181.6 lb

## 2016-04-28 DIAGNOSIS — O34219 Maternal care for unspecified type scar from previous cesarean delivery: Secondary | ICD-10-CM

## 2016-04-28 DIAGNOSIS — Z3493 Encounter for supervision of normal pregnancy, unspecified, third trimester: Secondary | ICD-10-CM

## 2016-04-28 LAB — POCT URINALYSIS DIPSTICK
GLUCOSE UA: NEGATIVE
Ketones, UA: NEGATIVE
Nitrite, UA: NEGATIVE
PH UA: 6.5
SPEC GRAV UA: 1.02
UROBILINOGEN UA: 0.2

## 2016-04-28 MED ORDER — PANTOPRAZOLE SODIUM 20 MG PO TBEC: 20.0000 mg | DELAYED_RELEASE_TABLET | Freq: Every day | ORAL | Status: DC

## 2016-04-28 NOTE — Progress Notes (Signed)
Complaining of increased pelvic pressure. No regular contractions. Good fetal movement. Lower extremity edema worsens during the day but improves by morning. No preeclampsia symptoms.

## 2016-05-06 ENCOUNTER — Ambulatory Visit (INDEPENDENT_AMBULATORY_CARE_PROVIDER_SITE_OTHER): Payer: BC Managed Care – PPO | Admitting: Obstetrics and Gynecology

## 2016-05-06 VITALS — BP 120/67 | HR 109 | Wt 184.1 lb

## 2016-05-06 DIAGNOSIS — Z3493 Encounter for supervision of normal pregnancy, unspecified, third trimester: Secondary | ICD-10-CM

## 2016-05-06 DIAGNOSIS — O34219 Maternal care for unspecified type scar from previous cesarean delivery: Secondary | ICD-10-CM

## 2016-05-06 DIAGNOSIS — Z3483 Encounter for supervision of other normal pregnancy, third trimester: Secondary | ICD-10-CM

## 2016-05-06 DIAGNOSIS — O2620 Pregnancy care for patient with recurrent pregnancy loss, unspecified trimester: Secondary | ICD-10-CM

## 2016-05-06 LAB — POCT URINALYSIS DIPSTICK
Bilirubin, UA: NEGATIVE
Glucose, UA: NEGATIVE
Ketones, UA: NEGATIVE
NITRITE UA: NEGATIVE
PROTEIN UA: NEGATIVE
RBC UA: NEGATIVE
SPEC GRAV UA: 1.01
UROBILINOGEN UA: 0.2
pH, UA: 6.5

## 2016-05-06 NOTE — Progress Notes (Signed)
ROB: Patient c/o pelvic cramping, no contractions.  Notes bilateral feet swelling. BPs wnl.  Discussed elevating legs, use of compression stockings. Scheduled for repeat C-section 05/11/16.  Pre-op done today.  Labor precautions given.

## 2016-05-10 ENCOUNTER — Encounter
Admission: RE | Admit: 2016-05-10 | Discharge: 2016-05-10 | Disposition: A | Discharge status: Home / Self Care | Payer: BC Managed Care – PPO | Source: Ambulatory Visit | Attending: Obstetrics and Gynecology | Admitting: Obstetrics and Gynecology

## 2016-05-10 LAB — CBC
HEMATOCRIT: 32.9 % — AB (ref 35.0–47.0)
HEMOGLOBIN: 11 g/dL — AB (ref 12.0–16.0)
MCH: 25.8 pg — AB (ref 26.0–34.0)
MCHC: 33.5 g/dL (ref 32.0–36.0)
MCV: 77.1 fL — AB (ref 80.0–100.0)
PLATELETS: 185 10*3/uL (ref 150–440)
RBC: 4.27 MIL/uL (ref 3.80–5.20)
RDW: 15.7 % — ABNORMAL HIGH (ref 11.5–14.5)
WBC: 8.9 10*3/uL (ref 3.6–11.0)

## 2016-05-10 LAB — RAPID HIV SCREEN (HIV 1/2 AB+AG)
HIV 1/2 Antibodies: NONREACTIVE
HIV-1 P24 Antigen - HIV24: NONREACTIVE

## 2016-05-10 LAB — TYPE AND SCREEN
ABO/RH(D): A POS
ANTIBODY SCREEN: NEGATIVE
EXTEND SAMPLE REASON: UNDETERMINED

## 2016-05-10 NOTE — Patient Instructions (Signed)
  Your procedure is scheduled on:05/11/16 Report to labor Delivery. 10:45 arrival  Remember: Instructions that are not followed completely may result in serious medical risk, up to and including death, or upon the discretion of your surgeon and anesthesiologist your surgery may need to be rescheduled.    __x__ 1. Do not eat food or drink liquids after midnight. No gum chewing or hard candies.     _x___ 2. No Alcohol for 24 hours before or after surgery.   ____ 3. Do Not Smoke For 24 Hours Prior to Your Surgery.   ____ 4. Bring all medications with you on the day of surgery if instructed.    ___x_ 5. Notify your doctor if there is any change in your medical condition     (cold, fever, infections).       Do not wear jewelry, make-up, hairpins, clips or nail polish.  Do not wear lotions, powders, or perfumes. You may wear deodorant.  Do not shave 48 hours prior to surgery. Men may shave face and neck.  Do not bring valuables to the hospital.    [facility] is not responsible for any belongings or valuables.               Contacts, dentures or bridgework may not be worn into surgery.  Leave your suitcase in the car. After surgery it may be brought to your room.  For patients admitted to the hospital, discharge time is determined by your                treatment team.   Patients discharged the day of surgery will not be allowed to drive home.   Please read over the following fact sheets that you were given:   Surgical Site Infection Prevention   ___x_ Take these medicines the morning of surgery with A SIP OF WATER:    1. protonix tonight and in am before suregery  2.   3.   4.  5.  6.  ____ Fleet Enema (as directed)   _x___ Use CHG wipes  ____ Use inhalers on the day of surgery  ____ Stop metformin 2 days prior to surgery    ____ Take 1/2 of usual insulin dose the night before surgery and none on the morning of surgery.   ____ Stop Coumadin/Plavix/aspirin on   ____  Stop Anti-inflammatories on   ____ Stop supplements until after surgery.    ____ Bring C-Pap to the hospital.

## 2016-05-11 ENCOUNTER — Inpatient Hospital Stay: Payer: BC Managed Care – PPO | Admitting: Anesthesiology

## 2016-05-11 ENCOUNTER — Encounter
Admission: RE | Disposition: A | Discharge status: Home / Self Care | Payer: Self-pay | Source: Ambulatory Visit | Attending: Obstetrics and Gynecology

## 2016-05-11 ENCOUNTER — Inpatient Hospital Stay
Admission: RE | Admit: 2016-05-11 | Discharge: 2016-05-14 | DRG: 766 | Disposition: A | Discharge status: Home / Self Care | Payer: BC Managed Care – PPO | Source: Ambulatory Visit | Attending: Obstetrics and Gynecology | Admitting: Obstetrics and Gynecology

## 2016-05-11 DIAGNOSIS — Z833 Family history of diabetes mellitus: Secondary | ICD-10-CM | POA: Diagnosis not present

## 2016-05-11 DIAGNOSIS — O9081 Anemia of the puerperium: Secondary | ICD-10-CM | POA: Diagnosis present

## 2016-05-11 DIAGNOSIS — O34211 Maternal care for low transverse scar from previous cesarean delivery: Secondary | ICD-10-CM | POA: Diagnosis present

## 2016-05-11 DIAGNOSIS — Z8249 Family history of ischemic heart disease and other diseases of the circulatory system: Secondary | ICD-10-CM

## 2016-05-11 DIAGNOSIS — K219 Gastro-esophageal reflux disease without esophagitis: Secondary | ICD-10-CM | POA: Diagnosis present

## 2016-05-11 DIAGNOSIS — Z3A39 39 weeks gestation of pregnancy: Secondary | ICD-10-CM

## 2016-05-11 DIAGNOSIS — Z98891 History of uterine scar from previous surgery: Secondary | ICD-10-CM

## 2016-05-11 DIAGNOSIS — Z888 Allergy status to other drugs, medicaments and biological substances status: Secondary | ICD-10-CM | POA: Diagnosis not present

## 2016-05-11 DIAGNOSIS — O9962 Diseases of the digestive system complicating childbirth: Secondary | ICD-10-CM | POA: Diagnosis present

## 2016-05-11 DIAGNOSIS — Z9889 Other specified postprocedural states: Secondary | ICD-10-CM | POA: Diagnosis not present

## 2016-05-11 DIAGNOSIS — Z3493 Encounter for supervision of normal pregnancy, unspecified, third trimester: Secondary | ICD-10-CM | POA: Diagnosis not present

## 2016-05-11 LAB — RPR: RPR Ser Ql: NONREACTIVE

## 2016-05-11 SURGERY — Surgical Case
Anesthesia: Spinal

## 2016-05-11 MED ORDER — KETOROLAC TROMETHAMINE 15 MG/ML IJ SOLN
INTRAMUSCULAR | Status: AC
  Administered 2016-05-11: 15 mg
  Filled 2016-05-11: qty 1

## 2016-05-11 MED ORDER — OXYTOCIN 40 UNITS IN LACTATED RINGERS INFUSION - SIMPLE MED: 2.5000 [IU]/h | INTRAVENOUS | Status: DC

## 2016-05-11 MED ORDER — NALBUPHINE HCL 10 MG/ML IJ SOLN: 5.0000 mg | Freq: Once | INTRAMUSCULAR | Status: DC | PRN

## 2016-05-11 MED ORDER — COCONUT OIL OIL: 1.0000 "application " | TOPICAL_OIL | Status: DC | PRN

## 2016-05-11 MED ORDER — DIPHENHYDRAMINE HCL 25 MG PO CAPS: 25.0000 mg | ORAL_CAPSULE | Freq: Four times a day (QID) | ORAL | Status: DC | PRN

## 2016-05-11 MED ORDER — LACTATED RINGERS IV SOLN: INTRAVENOUS | Status: DC

## 2016-05-11 MED ORDER — FENTANYL CITRATE (PF) 100 MCG/2ML IJ SOLN
INTRAMUSCULAR | Status: DC | PRN
  Administered 2016-05-11: 20 ug via INTRAVENOUS

## 2016-05-11 MED ORDER — ACETAMINOPHEN 325 MG PO TABS
ORAL_TABLET | ORAL | Status: AC
  Filled 2016-05-11: qty 1

## 2016-05-11 MED ORDER — ZOLPIDEM TARTRATE 5 MG PO TABS: 5.0000 mg | ORAL_TABLET | Freq: Every evening | ORAL | Status: DC | PRN

## 2016-05-11 MED ORDER — PHENYLEPHRINE 40 MCG/ML (10ML) SYRINGE FOR IV PUSH (FOR BLOOD PRESSURE SUPPORT)
PREFILLED_SYRINGE | INTRAVENOUS | Status: DC | PRN
  Administered 2016-05-11 (×2): 100 ug via INTRAVENOUS

## 2016-05-11 MED ORDER — DIPHENHYDRAMINE HCL 50 MG/ML IJ SOLN
INTRAMUSCULAR | Status: AC
  Administered 2016-05-11: 12.5 mg via INTRAVENOUS
  Filled 2016-05-11: qty 1

## 2016-05-11 MED ORDER — KETOROLAC TROMETHAMINE 30 MG/ML IJ SOLN: 30.0000 mg | Freq: Four times a day (QID) | INTRAMUSCULAR | Status: DC

## 2016-05-11 MED ORDER — GLYCOPYRROLATE 0.2 MG/ML IJ SOLN
INTRAMUSCULAR | Status: DC | PRN
  Administered 2016-05-11: 0.2 mg via INTRAVENOUS

## 2016-05-11 MED ORDER — OXYTOCIN 40 UNITS IN LACTATED RINGERS INFUSION - SIMPLE MED
INTRAVENOUS | Status: AC
  Filled 2016-05-11: qty 1000

## 2016-05-11 MED ORDER — OXYCODONE-ACETAMINOPHEN 5-325 MG PO TABS
2.0000 | ORAL_TABLET | ORAL | Status: DC | PRN
  Administered 2016-05-12 – 2016-05-14 (×11): 2 via ORAL
  Filled 2016-05-11 (×11): qty 2

## 2016-05-11 MED ORDER — LACTATED RINGERS IV SOLN
INTRAVENOUS | Status: DC
  Administered 2016-05-11 (×2): via INTRAVENOUS

## 2016-05-11 MED ORDER — IBUPROFEN 600 MG PO TABS
600.0000 mg | ORAL_TABLET | Freq: Four times a day (QID) | ORAL | Status: DC
  Administered 2016-05-12 – 2016-05-14 (×8): 600 mg via ORAL
  Filled 2016-05-11 (×8): qty 1

## 2016-05-11 MED ORDER — OXYTOCIN 40 UNITS IN LACTATED RINGERS INFUSION - SIMPLE MED
INTRAVENOUS | Status: DC | PRN
Start: 2016-05-11 — End: 2016-05-11
  Administered 2016-05-11: 500 mL via INTRAVENOUS
  Administered 2016-05-11: 100 mL via INTRAVENOUS

## 2016-05-11 MED ORDER — SCOPOLAMINE 1 MG/3DAYS TD PT72: 1.0000 | MEDICATED_PATCH | Freq: Once | TRANSDERMAL | Status: DC

## 2016-05-11 MED ORDER — DIBUCAINE 1 % RE OINT: 1.0000 "application " | TOPICAL_OINTMENT | RECTAL | Status: DC | PRN

## 2016-05-11 MED ORDER — OXYCODONE HCL 5 MG PO TABS: 5.0000 mg | ORAL_TABLET | ORAL | Status: DC | PRN

## 2016-05-11 MED ORDER — ACETAMINOPHEN 325 MG PO TABS: 650.0000 mg | ORAL_TABLET | ORAL | Status: DC | PRN

## 2016-05-11 MED ORDER — FERROUS SULFATE 325 (65 FE) MG PO TABS
325.0000 mg | ORAL_TABLET | Freq: Two times a day (BID) | ORAL | Status: DC
  Administered 2016-05-12 – 2016-05-14 (×4): 325 mg via ORAL
  Filled 2016-05-11 (×5): qty 1

## 2016-05-11 MED ORDER — NALOXONE HCL 0.4 MG/ML IJ SOLN: 0.4000 mg | INTRAMUSCULAR | Status: DC | PRN

## 2016-05-11 MED ORDER — SENNOSIDES-DOCUSATE SODIUM 8.6-50 MG PO TABS
2.0000 | ORAL_TABLET | ORAL | Status: DC
  Administered 2016-05-11: 2 via ORAL
  Filled 2016-05-11: qty 2

## 2016-05-11 MED ORDER — KETOROLAC TROMETHAMINE 30 MG/ML IJ SOLN
30.0000 mg | Freq: Four times a day (QID) | INTRAMUSCULAR | Status: DC
  Administered 2016-05-11 – 2016-05-12 (×3): 30 mg via INTRAVENOUS
  Filled 2016-05-11 (×3): qty 1

## 2016-05-11 MED ORDER — DEXTROSE 5 % IV SOLN
1.0000 ug/kg/h | INTRAVENOUS | Status: DC | PRN
  Filled 2016-05-11: qty 2

## 2016-05-11 MED ORDER — MORPHINE SULFATE (PF) 0.5 MG/ML IJ SOLN
INTRAMUSCULAR | Status: DC | PRN
  Administered 2016-05-11: .1 mg via EPIDURAL

## 2016-05-11 MED ORDER — NALBUPHINE HCL 10 MG/ML IJ SOLN: 5.0000 mg | INTRAMUSCULAR | Status: DC | PRN

## 2016-05-11 MED ORDER — OXYTOCIN 40 UNITS IN LACTATED RINGERS INFUSION - SIMPLE MED: INTRAVENOUS | Status: DC | PRN

## 2016-05-11 MED ORDER — SODIUM CHLORIDE 0.9% FLUSH: 3.0000 mL | INTRAVENOUS | Status: DC | PRN

## 2016-05-11 MED ORDER — MENTHOL 3 MG MT LOZG
1.0000 | LOZENGE | OROMUCOSAL | Status: DC | PRN
  Filled 2016-05-11: qty 9

## 2016-05-11 MED ORDER — DIPHENHYDRAMINE HCL 50 MG/ML IJ SOLN
INTRAMUSCULAR | Status: AC
  Filled 2016-05-11: qty 1

## 2016-05-11 MED ORDER — OXYCODONE-ACETAMINOPHEN 5-325 MG PO TABS: 1.0000 | ORAL_TABLET | ORAL | Status: DC | PRN

## 2016-05-11 MED ORDER — CEFAZOLIN SODIUM-DEXTROSE 2-4 GM/100ML-% IV SOLN
2.0000 g | INTRAVENOUS | Status: AC
  Administered 2016-05-11: 2 g via INTRAVENOUS
  Filled 2016-05-11: qty 100

## 2016-05-11 MED ORDER — MEPERIDINE HCL 25 MG/ML IJ SOLN: 6.2500 mg | INTRAMUSCULAR | Status: DC | PRN

## 2016-05-11 MED ORDER — MAGNESIUM HYDROXIDE 400 MG/5ML PO SUSP: 30.0000 mL | ORAL | Status: DC | PRN

## 2016-05-11 MED ORDER — ONDANSETRON HCL 4 MG/2ML IJ SOLN: 4.0000 mg | Freq: Three times a day (TID) | INTRAMUSCULAR | Status: DC | PRN

## 2016-05-11 MED ORDER — BUPIVACAINE IN DEXTROSE 0.75-8.25 % IT SOLN
INTRATHECAL | Status: DC | PRN
  Administered 2016-05-11: 1.8 mL via INTRATHECAL

## 2016-05-11 MED ORDER — FENTANYL CITRATE (PF) 100 MCG/2ML IJ SOLN: 25.0000 ug | INTRAMUSCULAR | Status: DC | PRN

## 2016-05-11 MED ORDER — ACETAMINOPHEN 500 MG PO TABS
1000.0000 mg | ORAL_TABLET | Freq: Four times a day (QID) | ORAL | Status: AC
  Administered 2016-05-11 (×2): 1000 mg via ORAL
  Filled 2016-05-11: qty 2

## 2016-05-11 MED ORDER — DIPHENHYDRAMINE HCL 50 MG/ML IJ SOLN
12.5000 mg | INTRAMUSCULAR | Status: DC | PRN
  Administered 2016-05-11: 12.5 mg via INTRAVENOUS

## 2016-05-11 MED ORDER — EPHEDRINE SULFATE 50 MG/ML IJ SOLN
INTRAMUSCULAR | Status: DC | PRN
  Administered 2016-05-11: 10 mg via INTRAVENOUS

## 2016-05-11 MED ORDER — PRENATAL MULTIVITAMIN CH
1.0000 | ORAL_TABLET | Freq: Every day | ORAL | Status: DC
  Administered 2016-05-12 – 2016-05-13 (×2): 1 via ORAL
  Filled 2016-05-11 (×2): qty 1

## 2016-05-11 MED ORDER — SIMETHICONE 80 MG PO CHEW
80.0000 mg | CHEWABLE_TABLET | ORAL | Status: DC | PRN
  Administered 2016-05-12: 80 mg via ORAL
  Filled 2016-05-11: qty 1

## 2016-05-11 MED ORDER — DIPHENHYDRAMINE HCL 25 MG PO CAPS
25.0000 mg | ORAL_CAPSULE | ORAL | Status: DC | PRN
  Administered 2016-05-11: 25 mg via ORAL
  Filled 2016-05-11: qty 1

## 2016-05-11 MED ORDER — LIDOCAINE 5 % EX PTCH
1.0000 | MEDICATED_PATCH | CUTANEOUS | Status: DC
  Administered 2016-05-12 – 2016-05-13 (×2): 1 via TRANSDERMAL
  Filled 2016-05-11 (×2): qty 1

## 2016-05-11 MED ORDER — SOD CITRATE-CITRIC ACID 500-334 MG/5ML PO SOLN
ORAL | Status: AC
  Administered 2016-05-11: 30 mL
  Filled 2016-05-11: qty 15

## 2016-05-11 MED ORDER — WITCH HAZEL-GLYCERIN EX PADS: 1.0000 "application " | MEDICATED_PAD | CUTANEOUS | Status: DC | PRN

## 2016-05-11 MED ORDER — SIMETHICONE 80 MG PO CHEW
80.0000 mg | CHEWABLE_TABLET | ORAL | Status: DC
  Administered 2016-05-11: 80 mg via ORAL
  Filled 2016-05-11: qty 1

## 2016-05-11 MED ORDER — ACETAMINOPHEN 500 MG PO TABS
ORAL_TABLET | ORAL | Status: AC
  Administered 2016-05-11: 1000 mg via ORAL
  Filled 2016-05-11: qty 2

## 2016-05-11 MED ORDER — ONDANSETRON HCL 4 MG/2ML IJ SOLN
INTRAMUSCULAR | Status: DC | PRN
  Administered 2016-05-11: 4 mg via INTRAVENOUS

## 2016-05-11 SURGICAL SUPPLY — 27 items
BAG COUNTER SPONGE EZ (MISCELLANEOUS) ×2 IMPLANT
CANISTER SUCT 3000ML (MISCELLANEOUS) ×3 IMPLANT
CHLORAPREP W/TINT 26ML (MISCELLANEOUS) ×6 IMPLANT
CLOSURE WOUND 1/2 X4 (GAUZE/BANDAGES/DRESSINGS) ×1
COUNTER SPONGE BAG EZ (MISCELLANEOUS) ×1
DRSG OPSITE POSTOP 4X10 (GAUZE/BANDAGES/DRESSINGS) ×3 IMPLANT
DRSG TELFA 3X8 NADH (GAUZE/BANDAGES/DRESSINGS) IMPLANT
ELECT REM PT RETURN 9FT ADLT (ELECTROSURGICAL) ×3
ELECTRODE REM PT RTRN 9FT ADLT (ELECTROSURGICAL) ×1 IMPLANT
GAUZE SPONGE 4X4 12PLY STRL (GAUZE/BANDAGES/DRESSINGS) ×3 IMPLANT
GLOVE BIO SURGEON STRL SZ 6 (GLOVE) ×3 IMPLANT
GLOVE BIOGEL PI IND STRL 6.5 (GLOVE) ×1 IMPLANT
GLOVE BIOGEL PI INDICATOR 6.5 (GLOVE) ×2
GOWN STRL REUS W/ TWL LRG LVL3 (GOWN DISPOSABLE) ×2 IMPLANT
GOWN STRL REUS W/TWL LRG LVL3 (GOWN DISPOSABLE) ×4
KIT RM TURNOVER STRD PROC AR (KITS) ×3 IMPLANT
NS IRRIG 1000ML POUR BTL (IV SOLUTION) ×3 IMPLANT
PACK C SECTION AR (MISCELLANEOUS) ×3 IMPLANT
PAD OB MATERNITY 4.3X12.25 (PERSONAL CARE ITEMS) ×3 IMPLANT
PAD PREP 24X41 OB/GYN DISP (PERSONAL CARE ITEMS) ×3 IMPLANT
STRIP CLOSURE SKIN 1/2X4 (GAUZE/BANDAGES/DRESSINGS) ×2 IMPLANT
SUT MNCRL AB 4-0 PS2 18 (SUTURE) ×3 IMPLANT
SUT PLAIN 2 0 XLH (SUTURE) IMPLANT
SUT VIC AB 0 CT1 36 (SUTURE) ×12 IMPLANT
SUT VIC AB 3-0 SH 27 (SUTURE) ×2
SUT VIC AB 3-0 SH 27X BRD (SUTURE) ×1 IMPLANT
SWABSTK COMLB BENZOIN TINCTURE (MISCELLANEOUS) ×3 IMPLANT

## 2016-05-11 NOTE — Anesthesia Preprocedure Evaluation (Signed)
Anesthesia Evaluation  Patient identified by MRN, date of birth, ID band Patient awake    Reviewed: Allergy & Precautions, NPO status , Patient's Chart, lab work & pertinent test results  History of Anesthesia Complications Negative for: history of anesthetic complications  Airway Mallampati: I  TM Distance: >3 FB Neck ROM: Full    Dental no notable dental hx.    Pulmonary neg COPD,    breath sounds clear to auscultation- rhonchi (-) wheezing      Cardiovascular Exercise Tolerance: Good (-) hypertension(-) angina(-) CAD and (-) Past MI  Rhythm:Regular Rate:Normal - Systolic murmurs and - Diastolic murmurs    Neuro/Psych  Headaches, Anxiety Depression    GI/Hepatic Neg liver ROS, GERD (during pregnancy only)  ,  Endo/Other  negative endocrine ROSneg diabetes  Renal/GU negative Renal ROS     Musculoskeletal negative musculoskeletal ROS (+)   Abdominal (+) - obese, Gravid abdomen   Peds  Hematology negative hematology ROS (+)   Anesthesia Other Findings   Reproductive/Obstetrics (+) Pregnancy                             Anesthesia Physical Anesthesia Plan  ASA: II  Anesthesia Plan: Spinal   Post-op Pain Management:    Induction:   Airway Management Planned: Natural Airway  Additional Equipment:   Intra-op Plan:   Post-operative Plan:   Informed Consent: I have reviewed the patients History and Physical, chart, labs and discussed the procedure including the risks, benefits and alternatives for the proposed anesthesia with the patient or authorized representative who has indicated his/her understanding and acceptance.     Plan Discussed with: CRNA and Anesthesiologist  Anesthesia Plan Comments:         Lab Results  Component Value Date   WBC 8.9 05/10/2016   HGB 11.0 (L) 05/10/2016   HCT 32.9 (L) 05/10/2016   MCV 77.1 (L) 05/10/2016   PLT 185 05/10/2016     Anesthesia Quick Evaluation

## 2016-05-11 NOTE — Op Note (Signed)
Cesarean Section Procedure Note  Indications: previous uterine incision (low-transverse)  Pre-operative Diagnosis: 39 week 0 day pregnancy., h/o prior C-section x 1 declines TOLAC  Post-operative Diagnosis: same  Surgeon: Hildred Laser, MD  Assistants: Enos Fling, Surgical Scrub Assist  Procedure: Repeat low transverse Cesarean Section  Anesthesia: Spinal anesthesia  Procedure Details: The patient was seen in the Holding Room. The risks, benefits, complications, treatment options, and expected outcomes were discussed with the patient.  The patient concurred with the proposed plan, giving informed consent.  The site of surgery properly noted/marked. The patient was taken to the Operating Room, identified as Jenny Delgado and the procedure verified as C-Section Delivery. A Time Out was held and the above information confirmed.  After induction of anesthesia, the patient was draped and prepped in the usual sterile manner. Anesthesia was tested and noted to be adequate. A Pfannenstiel incision was made and carried down through the subcutaneous tissue to the fascia. Fascial incision was made and extended transversely. The fascia was separated from the underlying rectus tissue superiorly and inferiorly. The peritoneum was identified and entered. Peritoneal incision was extended longitudinally. The utero-vesical peritoneal reflection was incised transversely and the bladder flap was bluntly freed from the lower uterine segment. A low transverse uterine incision was made. Delivered from cephalic presentation was a 3910 gram Female with Apgar scores of 8 at one minute and 9 at five minutes.  The umbilical cord was clamped and cut.  Cord blood was not obtained for evaluation. The placenta was removed intact and appeared normal. The uterus was exteriorized and cleared of all clots and debris. The uterine outline, tubes and ovaries appeared normal.  The uterine incision was closed with running locked  sutures of 0-Vicryl.  A second suture of 0-Vicryl was used in an imbricating layer.  Hemostasis was observed. Lavage was carried out until clear. The fascia was then reapproximated with a running suture of 0- Vicryl. The subcutaneous fat layer was reapproximated with 3-0 Vicryl.  The skin was reapproximated with 4-0 Monocryl.  Instrument, sponge, and needle counts were correct prior the abdominal closure and at the conclusion of the case.   Findings: Female infant, cephalic presentation, 3910 grams, with Apgar scores of 8 at one minute and 8 at five minutes. Intact placenta with 3 vessel cord.  The uterine outline, tubes and ovaries appeared normal.  Minimal adhesions noted.   Estimated Blood Loss:  600 ml  Drains: foley catheter to gravity drainage, 350 clear urine at end of the procedure         Total IV Fluids:  1000 ml  Specimens: None         Implants: None         Complications:  None; patient tolerated the procedure well.         Disposition: PACU - hemodynamically stable.         Condition: stable    Hildred Laser, MD [facility]

## 2016-05-11 NOTE — Transfer of Care (Signed)
Immediate Anesthesia Transfer of Care Note  Patient: Jenny Delgado  Procedure(s) Performed: Procedure(s): REPEAT CESAREAN SECTION (N/A)  Patient Location: PACU and Mother/Baby  Anesthesia Type:Spinal  Level of Consciousness: awake, alert  and oriented  Airway & Oxygen Therapy: Patient Spontanous Breathing  Post-op Assessment: Report given to RN and Post -op Vital signs reviewed and stable  Post vital signs: Reviewed and stable  Last Vitals:  Vitals:   05/11/16 1232                   05/11/16  BP: 119/67                        112/51  Pulse: 87                                  72  Resp: 16                                    16  Temp: 36.9 C                          97.67F    Last Pain:  Vitals:   05/11/16 1232  TempSrc: Oral         Complications: No apparent anesthesia complications

## 2016-05-11 NOTE — Consult Note (Signed)
Neonatology Note:   Attendance at C-section:    I was asked by Dr. Valentino Saxon to attend this repeat C/S at term. The mother is a 25 y.o. [id number] with IUP at [redacted]w[redacted]d. GBS negative with good prenatal care. ROM 0 hours before delivery, fluid clear. Infant vigorous with good spontaneous cry and tone. Needed only minimal bulb suctioning. Ap 8/9. Lungs clear to ausc in DR. To CN to care of Pediatrician.  Dineen Kid Leary Roca, MD

## 2016-05-11 NOTE — H&P (Signed)
Obstetric Preoperative History and Physical  Jenny Delgado is a 25 y.o. [phone] with IUP at [redacted]w[redacted]d presenting for presenting for scheduled repeat cesarean section.  No acute concerns.  H/o prior C-section x 1, declines TOLAC.   Prenatal Course Source of Care: [facility]  with onset of care at 9 weeks Pregnancy complications or risks: Patient Active Problem List   Diagnosis Date Noted  . Gastroesophageal reflux in pregancy in third trimester 12/30/2015  . Supervision of normal pregnancy in third trimester 12/03/2015  . 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 Previous cesarean delivery, antepartum 10/01/2015  . Miscarriage within last 12 months 10/01/2015  . H/O abnormal cervical Papanicolaou smear 03/27/2015  . Anxiety and depression 03/27/2015   She plans to breastfeed She desires Mirena IUD for postpartum contraception.   Prenatal labs and studies: ABO, Rh: --/--/A POS (07/24 1035) Antibody: NEG (07/24 1035) Rubella: 7.47 (12/23 0945) RPR: Non Reactive (07/24 1029)  HBsAg: Negative (12/23 0945)  HIV: Non Reactive (12/23 0945)  GBS: Negative (6/27 1650) 1 hr Glucola 138, normal Genetic screening normal Anatomy US normal   Past Medical History:  Diagnosis Date  . Anxiety   . Depression   . Headache(784.0)   . IUD contraception    mirena    Past Surgical History:  Procedure Laterality Date  . CESAREAN SECTION  2008  . WISDOM TOOTH EXTRACTION      OB History  Gravida Para Term Preterm AB Living  6 1 1   4 1   SAB TAB Ectopic Multiple Live Births  3       1    # Outcome Date GA Lbr Len/2nd Weight Sex Delivery Anes PTL Lv  6 Current           5 SAB 05/2015        FD  4 Term 09/20/07 [redacted]w[redacted]d  7 lb 8 oz (3.402 kg) M CS-LTranv EPI  LIV  3 AB           2 SAB           1 SAB               Social History   Social History  . Marital status: Married    Spouse name: N/A  . Number of children: N/A  . Years of  education: N/A   Social History Main Topics  . Smoking status: Never Smoker  . Smokeless tobacco: Never Used  . Alcohol use Yes     Comment: ocassional wine coolers   . Drug use: No  . Sexual activity: Yes    Birth control/ protection: None     Comment: Pregnant    Other Topics Concern  . Not on file   Social History Narrative  . No narrative on file    Family History  Problem Relation Age of Onset  . Hypertension Father   . Diabetes Maternal Grandfather   . Breast cancer Neg Hx   . Cervical cancer Neg Hx   . Colon cancer Neg Hx   . Ovarian cancer Neg Hx   . Heart disease Neg Hx     No prescriptions prior to admission.    Allergies  Allergen Reactions  . Bactrim [Sulfamethoxazole-Trimethoprim] Rash  . Lexapro [Escitalopram Oxalate] Rash    Facial numbness  . Zoloft [Sertraline Hcl] Rash    Facial numbness    Review of Systems: Negative except for what is mentioned in HPI.  Physical Exam: LMP  08/12/2015 (Exact Date)  FHR by Doppler: 135 bpm GENERAL: Well-developed, well-nourished female in no acute distress.  LUNGS: Clear to auscultation bilaterally.  HEART: Regular rate and rhythm. ABDOMEN: Soft, nontender, nondistended, gravid, well-healed Pfannenstiel incision. PELVIC: Deferred EXTREMITIES: Nontender, no edema, 2+ distal pulses.   Pertinent Labs/Studies:   Results for orders placed or performed during the hospital encounter of 05/10/16 (from the past 72 hour(s))  CBC     Status: Abnormal   Collection Time: 05/10/16 10:29 AM  Result Value Ref Range   WBC 8.9 3.6 - 11.0 K/uL   RBC 4.27 3.80 - 5.20 MIL/uL   Hemoglobin 11.0 (L) 12.0 - 16.0 g/dL   HCT 13.0 (L) 86.5 - 78.4 %   MCV 77.1 (L) 80.0 - 100.0 fL   MCH 25.8 (L) 26.0 - 34.0 pg   MCHC 33.5 32.0 - 36.0 g/dL   RDW 69.6 (H) 29.5 - 28.4 %   Platelets 185 150 - 440 K/uL  Rapid HIV screen (HIV 1/2 Ab+Ag)     Status: None   Collection Time: 05/10/16 10:29 AM  Result Value Ref Range   HIV-1 P24  Antigen - HIV24 NON REACTIVE NON REACTIVE   HIV 1/2 Antibodies NON REACTIVE NON REACTIVE   Interpretation (HIV Ag Ab)      A non reactive test result means that HIV 1 or HIV 2 antibodies and HIV 1 p24 antigen were not detected in the specimen.  RPR     Status: None   Collection Time: 05/10/16 10:29 AM  Result Value Ref Range   RPR Ser Ql Non Reactive Non Reactive    Comment: (NOTE) Performed At: [facility] [address] [place], [place] [id number] Mila Homer MD VO:[id number]   Type and screen [facility]     Status: None   Collection Time: 05/10/16 10:35 AM  Result Value Ref Range   ABO/RH(D) A POS    Antibody Screen NEG    Sample Expiration 05/13/2016    Extend sample reason PREGNANT WITHIN 3 MONTHS, UNABLE TO EXTEND     Assessment and Plan :Jenny Delgado is a 25 y.o. [id number] at [redacted]w[redacted]d being admitted  for scheduled cesarean section delivery . The patient is understanding of the planned procedure and is aware of and accepting of all surgical risks, including but not limited to: bleeding which may require transfusion or reoperation; infection which may require antibiotics; injury to bowel, bladder, ureters or other surrounding organs which may require repair; injury to the fetus; need for additional procedures including hysterectomy in the event of life-threatening complications; placental abnormalities wth subsequent pregnancies; incisional problems; blood clot disorders which may require blood thinners;, and other postoperative/anesthesia complications. The patient is in agreement with the proposed plan, and gives informed written consent for the procedure. All questions have been answered.   Hildred Laser, MD [facility]

## 2016-05-11 NOTE — Progress Notes (Signed)
Turned, pericare done, gown changed.

## 2016-05-11 NOTE — Anesthesia Procedure Notes (Signed)
Spinal  Patient location during procedure: OB Start time: 05/11/2016 2:32 PM End time: 05/11/2016 2:40 PM Staffing Anesthesiologist: Randa Lynn, AMY Resident/CRNA: Dionne Bucy Performed: resident/CRNA  Preanesthetic Checklist Completed: patient identified, site marked, surgical consent, pre-op evaluation, timeout performed, IV checked, risks and benefits discussed and monitors and equipment checked Spinal Block Patient position: sitting Prep: ChloraPrep Patient monitoring: heart rate, continuous pulse ox, blood pressure and cardiac monitor Approach: midline Location: L4-5 Injection technique: single-shot Needle Needle type: Whitacre and Introducer  Needle gauge: 27 G Needle length: 9 cm Assessment Sensory level: T3 Additional Notes Negative paresthesia. Negative blood return. Positive free-flowing CSF. Expiration date of kit checked and confirmed. Patient tolerated procedure well, without complications.

## 2016-05-12 LAB — CBC
HCT: 27.2 % — ABNORMAL LOW (ref 35.0–47.0)
Hemoglobin: 9 g/dL — ABNORMAL LOW (ref 12.0–16.0)
MCH: 25.8 pg — ABNORMAL LOW (ref 26.0–34.0)
MCHC: 33 g/dL (ref 32.0–36.0)
MCV: 78.2 fL — ABNORMAL LOW (ref 80.0–100.0)
Platelets: 147 10*3/uL — ABNORMAL LOW (ref 150–440)
RBC: 3.48 MIL/uL — ABNORMAL LOW (ref 3.80–5.20)
RDW: 15.6 % — AB (ref 11.5–14.5)
WBC: 8.5 10*3/uL (ref 3.6–11.0)

## 2016-05-12 NOTE — Anesthesia Post-op Follow-up Note (Signed)
  Anesthesia Pain Follow-up Note  Patient: Jenny Delgado  Day #: 1  Date of Follow-up: 05/12/2016 Time: 7:07 AM  Last Vitals:  Vitals:   05/11/16 2305 05/12/16 0428  BP: (!) 125/49 118/62  Pulse: 70 75  Resp: 18 18  Temp: 36.8 C 36.8 C    Level of Consciousness: alert  Pain: mild   Side Effects:None  Catheter Site Exam: site not evaluated     Plan: D/C from anesthesia care  [name]

## 2016-05-12 NOTE — Anesthesia Postprocedure Evaluation (Signed)
Anesthesia Post Note  Patient: Jenny Delgado  Procedure(s) Performed: Procedure(s) (LRB): REPEAT CESAREAN SECTION (N/A)  Patient location during evaluation: Mother Baby Anesthesia Type: Spinal Level of consciousness: awake and alert and oriented Pain management: satisfactory to patient Vital Signs Assessment: post-procedure vital signs reviewed and stable Respiratory status: respiratory function stable Cardiovascular status: stable Postop Assessment: no headache, no backache, spinal receding, patient able to bend at knees, no signs of nausea or vomiting and adequate PO intake Anesthetic complications: no    Last Vitals:  Vitals:   05/11/16 2305 05/12/16 0428  BP: (!) 125/49 118/62  Pulse: 70 75  Resp: 18 18  Temp: 36.8 C 36.8 C    Last Pain:  Vitals:   05/12/16 0428  TempSrc: Oral  PainSc:                  [name]

## 2016-05-12 NOTE — Anesthesia Postprocedure Evaluation (Signed)
Anesthesia Post Note  Patient: Jenny Delgado  Procedure(s) Performed: Procedure(s) (LRB): REPEAT CESAREAN SECTION (N/A)  Anesthesia Post Evaluation  Last Vitals:  Vitals:   05/11/16 2305 05/12/16 0428  BP: (!) 125/49 118/62  Pulse: 70 75  Resp: 18 18  Temp: 36.8 C 36.8 C    Last Pain:  Vitals:   05/12/16 0428  TempSrc: Oral  PainSc:                  [name]

## 2016-05-12 NOTE — Progress Notes (Signed)
Postpartum Day # 1: Cesarean Delivery  Subjective: Patient reports tolerating PO and no problems voiding.  Denies fevers, chills, nausea/vomiting.  Does complain of gas pains and left groin pain, worsened with ambulation.   Objective: Vital signs in last 24 hours: Temp:  [97.9 F (36.6 C)-98.6 F (37 C)] 98.3 F (36.8 C) (07/26 1131) Pulse Rate:  [66-96] 74 (07/26 1131) Resp:  [18-20] 18 (07/26 1131) BP: (104-130)/(49-85) 104/58 (07/26 1131) SpO2:  [97 %-100 %] 100 % (07/26 0428)  Physical Exam:  General: alert and no distress Lungs: clear to auscultation bilaterally Breasts: normal appearance, no masses or tenderness Heart: regular rate and rhythm, S1, S2 normal, no murmur, click, rub or gallop Pelvis: Lochia appropriate, Uterine Fundus firm, Incision: healing well, no significant drainage, no dehiscence, no significant erythema Extremities: DVT Evaluation: Negative Homan's sign. No cords or calf tenderness. No significant calf/ankle edema.   Recent Labs  05/10/16 1029 05/12/16 0500  HGB 11.0* 9.0*  HCT 32.9* 27.2*    Assessment/Plan: Status post Cesarean section. Doing well postoperatively.  Plan for discharge tomorrow, Breastfeeding, Lactation consult and Contraception Mirena IUD Advance diet Continue PO pain management Continue current care Warm compress to groin for pain (patient took pain meds with no relief).  Simethicone for gas pain.    Hildred Laser, MD [facility]

## 2016-05-13 NOTE — Lactation Note (Signed)
This note was copied from a baby's chart. Lactation Consultation Note  Patient Name: Jenny Delgado [id number] Date: 05/13/2016 Reason for consult: Follow-up assessment   Maternal Data Formula Feeding for Exclusion: No Does the patient have breastfeeding experience prior to this delivery?: Yes  Feeding Feeding Type: Breast Fed Length of feed: 5 min  LATCH Score/Interventions Latch: Grasps breast easily, tongue down, lips flanged, rhythmical sucking.  Audible Swallowing: Spontaneous and intermittent Intervention(s): Skin to skin  Type of Nipple: Everted at rest and after stimulation  Comfort (Breast/Nipple): Soft / non-tender     Hold (Positioning): No assistance needed to correctly position infant at breast. Intervention(s): Breastfeeding basics reviewed  LATCH Score: 10  Lactation Tools Discussed/Used Tools: Pump (mother pumped see note ) WIC Program: No  Mother does not need to pump. It is not recommended as the mother has a history of over production and this can be stimulated by pumping on top of breast feeding.   Consult Status Consult Status: PRN Follow-up type: In-patient    Trudee Grip 05/13/2016, 2:44 PM

## 2016-05-13 NOTE — Progress Notes (Signed)
Postpartum Day # 2: Cesarean Delivery  Subjective: Patient reports tolerating PO and no problems voiding.  Still noting left groin pain, somewhat improved with using warm compress and Percocet.   Objective: Vital signs in last 24 hours: Temp:  [98 F (36.7 C)-98.6 F (37 C)] 98.3 F (36.8 C) (07/27 1950) Pulse Rate:  [64-92] 92 (07/27 1950) Resp:  [14-20] 14 (07/27 1950) BP: (108-141)/(67-72) 108/67 (07/27 1950) SpO2:  [99 %] 99 % (07/27 1950)  Physical Exam:  General: alert and no distress Lungs: clear to auscultation bilaterally Breasts: normal appearance, no masses or tenderness Heart: regular rate and rhythm, S1, S2 normal, no murmur, click, rub or gallop Pelvis: Lochia appropriate, Uterine Fundus firm, Incision: healing well, no significant drainage, no dehiscence, no significant erythema Extremities: DVT Evaluation: Negative Homan's sign. No cords or calf tenderness. No significant calf/ankle edema.  Recent Labs  05/10/16 1029 05/12/16 0500  HGB 11.0* 9.0*  HCT 32.9* 27.2*       Assessment/Plan: Status post Cesarean section. Doing well postoperatively.  Plan for discharge tomorrow, Breastfeeding, Lactation consult and Contraception Mirena IUD Advance diet Continue PO pain management Continue current care Continue warm compress to groin for pain, encourage ambulation.  Mild anemia postpartum, asymptomatic.  Will treat with PO iron.    Hildred Laser, MD [facility]

## 2016-05-14 ENCOUNTER — Telehealth: Payer: Self-pay

## 2016-05-14 MED ORDER — OXYCODONE-ACETAMINOPHEN 5-325 MG PO TABS: 1.0000 | ORAL_TABLET | Freq: Four times a day (QID) | ORAL | 0 refills | Status: DC | PRN

## 2016-05-14 MED ORDER — IBUPROFEN 600 MG PO TABS: 600.0000 mg | ORAL_TABLET | Freq: Four times a day (QID) | ORAL | 0 refills | Status: DC

## 2016-05-14 MED ORDER — DOCUSATE SODIUM 100 MG PO CAPS: 100.0000 mg | ORAL_CAPSULE | Freq: Two times a day (BID) | ORAL | 2 refills | Status: DC | PRN

## 2016-05-14 MED ORDER — FERROUS SULFATE 325 (65 FE) MG PO TABS: 325.0000 mg | ORAL_TABLET | Freq: Two times a day (BID) | ORAL | 2 refills | Status: DC

## 2016-05-14 NOTE — Progress Notes (Signed)
Postpartum Day # 3: Cesarean Delivery  Subjective: Patient reports tolerating PO and no problems voiding + flatus.  Still noting left groin pain, somewhat improved with using warm compress and Percocet.   Objective: Vitals:   05/13/16 1950 05/14/16 0025 05/14/16 0349 05/14/16 0700  BP: 108/67 (!) 113/55 (!) 115/53 (!) 107/51  Pulse: 92 81 85 74  Resp: 14 16 14 16   Temp: 98.3 F (36.8 C) 98.4 F (36.9 C) 97.7 F (36.5 C) 98 F (36.7 C)  TempSrc: Oral Oral Oral Oral  SpO2: 99% 98% 98%   Weight:      Height:        Physical Exam:  General: alert and mild distress with pain Lungs: clear to auscultation bilaterally Breasts: normal appearance, no masses or tenderness Heart: regular rate and rhythm, S1, S2 normal, no murmur, click, rub or gallop Pelvis: Lochia appropriate, Uterine Fundus firm, Incision: healing well, no significant drainage, no dehiscence, no significant erythema Extremities: DVT Evaluation: Negative Homan's sign. No cords or calf tenderness. No significant calf/ankle edema.  Recent Labs  05/10/16 1029 05/12/16 0500  HGB 11.0* 9.0*  HCT 32.9* 27.2*       Assessment/Plan: Status post Cesarean section. Doing well postoperatively.  Discharge home once pain better controlled, Breastfeeding, Contraception Mirena IUD Advance diet Continue PO pain management Continue current care Continue warm/cold compress to groin for pain, encourage ambulation. May possibly be due to superficial nerve injury or severation during surgery, will likely resolve during postpartum period.  If not, can consider physical therapy.  Mild anemia postpartum, asymptomatic.  Will treat with PO iron.    Hildred Laser, MD [facility]

## 2016-05-14 NOTE — Telephone Encounter (Signed)
Called pt informed her that after speaking with her insurance company they will not cover her breast pump, advised pt on renting pump from [facility].

## 2016-05-14 NOTE — Discharge Instructions (Signed)
Please call your doctor or return to the ER if you experience any chest pains, shortness of breath, fever greater than 101, any heavy bleeding or large clots, and foul smelling vaginal discharge, any worsening abdominal pain & cramping that is not controlled by pain medication, or any signs of post partum depression.  No tampons, enemas, douches, or sexual intercourse for 6 weeks.  Also avoid tub baths, hot tubs, or swimming for 6 weeks.  Cesarean Delivery, Care After Refer to this sheet in the next few weeks. These instructions provide you with information on caring for yourself after your procedure. Your health care provider may also give you specific instructions. Your treatment has been planned according to current medical practices, but problems sometimes occur. Call your health care provider if you have any problems or questions after you go home. HOME CARE INSTRUCTIONS  Only take over-the-counter or prescription medications as directed by your health care provider.  Do not drink alcohol, especially if you are breastfeeding or taking medication to relieve pain.  Do not chew or smoke tobacco.  Continue to use good perineal care. Good perineal care includes:  Wiping your perineum from front to back.  Keeping your perineum clean.  Check your surgical cut (incision) daily for increased redness, drainage, swelling, or separation of skin.  Clean your incision gently with soap and water every day, and then pat it dry. If your health care provider says it is okay, leave the incision uncovered. Use a bandage (dressing) if the incision is draining fluid or appears irritated. If the adhesive strips across the incision do not fall off within 7 days, carefully peel them off.  Hug a pillow when coughing or sneezing until your incision is healed. This helps to relieve pain.  Do not use tampons or douche until your health care provider says it is okay.  Shower, wash your hair, and take tub baths as  directed by your health care provider.  Wear a well-fitting bra that provides breast support.  Limit wearing support panties or control-top hose.  Drink enough fluids to keep your urine clear or pale yellow.  Eat high-fiber foods such as whole grain cereals and breads, brown rice, beans, and fresh fruits and vegetables every day. These foods may help prevent or relieve constipation.  Resume activities such as climbing stairs, driving, lifting, exercising, or traveling as directed by your health care provider.  Talk to your health care provider about resuming sexual activities. This is dependent upon your risk of infection, your rate of healing, and your comfort and desire to resume sexual activity.  Try to have someone help you with your household activities and your newborn for at least a few days after you leave the hospital.  Rest as much as possible. Try to rest or take a nap when your newborn is sleeping.  Increase your activities gradually.  Keep all of your scheduled postpartum appointments. It is very important to keep your scheduled follow-up appointments. At these appointments, your health care provider will be checking to make sure that you are healing physically and emotionally. SEEK MEDICAL CARE IF:   You are passing large clots from your vagina. Save any clots to show your health care provider.  You have a foul smelling discharge from your vagina.  You have trouble urinating.  You are urinating frequently.  You have pain when you urinate.  You have a change in your bowel movements.  You have increasing redness, pain, or swelling near your incision.  You have pus draining from your incision.  Your incision is separating.  You have painful, hard, or reddened breasts.  You have a severe headache.  You have blurred vision or see spots.  You feel sad or depressed.  You have thoughts of hurting yourself or your newborn.  You have questions about your care,  the care of your newborn, or medications.  You are dizzy or light-headed.  You have a rash.  You have pain, redness, or swelling at the site of the removed intravenous access (IV) tube.  You have nausea or vomiting.  You stopped breastfeeding and have not had a menstrual period within 12 weeks of stopping.  You are not breastfeeding and have not had a menstrual period within 12 weeks of delivery.  You have a fever. SEEK IMMEDIATE MEDICAL CARE IF:  You have persistent pain.  You have chest pain.  You have shortness of breath.  You faint.  You have leg pain.  You have stomach pain.  Your vaginal bleeding saturates 2 or more sanitary pads in 1 hour. MAKE SURE YOU:   Understand these instructions.  Will watch your condition.  Will get help right away if you are not doing well or get worse.   This information is not intended to replace advice given to you by your health care provider. Make sure you discuss any questions you have with your health care provider.   Document Released: 06/26/2002 Document Revised: 10/25/2014 Document Reviewed: 05/31/2012 Elsevier Interactive Patient Education [employer]

## 2016-05-14 NOTE — Progress Notes (Signed)
D/C order from MD.  Reviewed d/c instructions and prescriptions with patient and answered any questions.  Patient d/c home with infant via wheelchair by nursing/auxillary. 

## 2016-05-14 NOTE — Discharge Summary (Signed)
Obstetric Discharge Summary Reason for Admission: cesarean section Prenatal Procedures: ultrasound Intrapartum Procedures: cesarean: low cervical, transverse Postpartum Procedures: none Complications-Operative and Postpartum: none Hemoglobin  Date Value Ref Range Status  05/12/2016 9.0 (L) 12.0 - 16.0 g/dL Final   HCT  Date Value Ref Range Status  05/12/2016 27.2 (L) 35.0 - 47.0 % Final   Hematocrit  Date Value Ref Range Status  02/26/2016 33.9 (L) 34.0 - 46.6 % Final    Physical Exam:  General: alert and no distress Lochia: appropriate Uterine Fundus: firm Incision: healing well, no significant drainage, no dehiscence, no significant erythema DVT Evaluation: Negative Homan's sign. No cords or calf tenderness. No significant calf/ankle edema.  Discharge Diagnoses: Term Pregnancy-delivered and left post-surgical groin pain  Discharge Information: Date: 05/14/2016 Activity: pelvic rest Diet: routine Medications: PNV, Ibuprofen, Colace, Iron and Percocet Condition: stable Instructions: refer to practice specific booklet Discharge to: home Follow-up Information    Hildred Laser, MD .   Specialties:  Obstetrics and Gynecology, Radiology Why:  7-10 days for post-op check, 6 weeks for postpartum visit Contact information: [address] [place] [address] [phone]           Newborn Data: Live born female  Birth Weight: 8 lb 9.9 oz (3910 g) APGAR: 8, 9  Home with mother.  Hildred Laser 05/14/2016, 10:33 AM

## 2016-05-17 ENCOUNTER — Other Ambulatory Visit: Payer: Self-pay | Admitting: Obstetrics and Gynecology

## 2016-05-20 ENCOUNTER — Ambulatory Visit (INDEPENDENT_AMBULATORY_CARE_PROVIDER_SITE_OTHER): Payer: BC Managed Care – PPO | Admitting: Obstetrics and Gynecology

## 2016-05-20 ENCOUNTER — Encounter: Payer: Self-pay | Admitting: Obstetrics and Gynecology

## 2016-05-20 DIAGNOSIS — Z5189 Encounter for other specified aftercare: Secondary | ICD-10-CM

## 2016-05-20 DIAGNOSIS — R1032 Left lower quadrant pain: Secondary | ICD-10-CM

## 2016-05-20 MED ORDER — OXYCODONE-ACETAMINOPHEN 5-325 MG PO TABS: 1.0000 | ORAL_TABLET | Freq: Four times a day (QID) | ORAL | 0 refills | Status: DC | PRN

## 2016-05-22 ENCOUNTER — Emergency Department: Payer: BC Managed Care – PPO

## 2016-05-22 ENCOUNTER — Encounter: Payer: Self-pay | Admitting: Emergency Medicine

## 2016-05-22 ENCOUNTER — Inpatient Hospital Stay
Admission: EM | Admit: 2016-05-22 | Discharge: 2016-05-24 | DRG: 872 | Disposition: A | Discharge status: Home / Self Care | Payer: BC Managed Care – PPO | Attending: Internal Medicine | Admitting: Internal Medicine

## 2016-05-22 DIAGNOSIS — E876 Hypokalemia: Secondary | ICD-10-CM | POA: Diagnosis present

## 2016-05-22 DIAGNOSIS — Z833 Family history of diabetes mellitus: Secondary | ICD-10-CM | POA: Diagnosis not present

## 2016-05-22 DIAGNOSIS — K5900 Constipation, unspecified: Secondary | ICD-10-CM | POA: Diagnosis present

## 2016-05-22 DIAGNOSIS — O34219 Maternal care for unspecified type scar from previous cesarean delivery: Secondary | ICD-10-CM | POA: Diagnosis present

## 2016-05-22 DIAGNOSIS — I10 Essential (primary) hypertension: Secondary | ICD-10-CM | POA: Diagnosis present

## 2016-05-22 DIAGNOSIS — N390 Urinary tract infection, site not specified: Secondary | ICD-10-CM | POA: Diagnosis present

## 2016-05-22 DIAGNOSIS — A419 Sepsis, unspecified organism: Secondary | ICD-10-CM | POA: Diagnosis present

## 2016-05-22 DIAGNOSIS — R000 Tachycardia, unspecified: Secondary | ICD-10-CM | POA: Diagnosis present

## 2016-05-22 DIAGNOSIS — Z9889 Other specified postprocedural states: Secondary | ICD-10-CM | POA: Diagnosis not present

## 2016-05-22 DIAGNOSIS — Z888 Allergy status to other drugs, medicaments and biological substances status: Secondary | ICD-10-CM

## 2016-05-22 DIAGNOSIS — Z8249 Family history of ischemic heart disease and other diseases of the circulatory system: Secondary | ICD-10-CM

## 2016-05-22 LAB — COMPREHENSIVE METABOLIC PANEL
ALBUMIN: 3.5 g/dL (ref 3.5–5.0)
ALT: 33 U/L (ref 14–54)
ANION GAP: 8 (ref 5–15)
AST: 21 U/L (ref 15–41)
Alkaline Phosphatase: 101 U/L (ref 38–126)
BUN: 8 mg/dL (ref 6–20)
CHLORIDE: 108 mmol/L (ref 101–111)
CO2: 23 mmol/L (ref 22–32)
CREATININE: 0.62 mg/dL (ref 0.44–1.00)
Calcium: 8.7 mg/dL — ABNORMAL LOW (ref 8.9–10.3)
GFR calc non Af Amer: >60 mL/min (ref >60)
Glucose, Bld: 108 mg/dL — ABNORMAL HIGH (ref 65–99)
Potassium: 3.4 mmol/L — ABNORMAL LOW (ref 3.5–5.1)
SODIUM: 139 mmol/L (ref 135–145)
Total Bilirubin: 0.3 mg/dL (ref 0.3–1.2)
Total Protein: 7.2 g/dL (ref 6.5–8.1)

## 2016-05-22 LAB — CBC WITH DIFFERENTIAL/PLATELET
Basophils Absolute: 0 10*3/uL (ref 0–0.1)
Basophils Relative: 0 %
Eosinophils Absolute: 0 10*3/uL (ref 0–0.7)
Eosinophils Relative: 0 %
HEMATOCRIT: 33.1 % — AB (ref 35.0–47.0)
HEMOGLOBIN: 11.3 g/dL — AB (ref 12.0–16.0)
LYMPHS ABS: 0.9 10*3/uL — AB (ref 1.0–3.6)
Lymphocytes Relative: 9 %
MCH: 26 pg (ref 26.0–34.0)
MCHC: 34.3 g/dL (ref 32.0–36.0)
MCV: 75.9 fL — ABNORMAL LOW (ref 80.0–100.0)
MONOS PCT: 4 %
Monocytes Absolute: 0.4 10*3/uL (ref 0.2–0.9)
NEUTROS ABS: 8.9 10*3/uL — AB (ref 1.4–6.5)
NEUTROS PCT: 87 %
Platelets: 275 10*3/uL (ref 150–440)
RBC: 4.36 MIL/uL (ref 3.80–5.20)
RDW: 16.1 % — ABNORMAL HIGH (ref 11.5–14.5)
WBC: 10.2 10*3/uL (ref 3.6–11.0)

## 2016-05-22 LAB — URINALYSIS COMPLETE WITH MICROSCOPIC (ARMC ONLY)
BACTERIA UA: NONE SEEN
Bilirubin Urine: NEGATIVE
GLUCOSE, UA: NEGATIVE mg/dL
Ketones, ur: NEGATIVE mg/dL
Nitrite: NEGATIVE
Protein, ur: NEGATIVE mg/dL
Specific Gravity, Urine: 1.006 (ref 1.005–1.030)
pH: 7 (ref 5.0–8.0)

## 2016-05-22 LAB — LACTIC ACID, PLASMA: LACTIC ACID, VENOUS: 1 mmol/L (ref 0.5–1.9)

## 2016-05-22 MED ORDER — POTASSIUM CHLORIDE IN NACL 20-0.9 MEQ/L-% IV SOLN
INTRAVENOUS | Status: DC
  Administered 2016-05-22 – 2016-05-24 (×5): via INTRAVENOUS
  Filled 2016-05-22 (×8): qty 1000

## 2016-05-22 MED ORDER — MORPHINE SULFATE (PF) 2 MG/ML IV SOLN
2.0000 mg | Freq: Once | INTRAVENOUS | Status: AC
  Administered 2016-05-22: 2 mg via INTRAVENOUS
  Filled 2016-05-22: qty 1

## 2016-05-22 MED ORDER — DEXTROSE 5 % IV SOLN
1.0000 g | Freq: Once | INTRAVENOUS | Status: AC
  Administered 2016-05-22: 1 g via INTRAVENOUS
  Filled 2016-05-22: qty 10

## 2016-05-22 MED ORDER — DIATRIZOATE MEGLUMINE & SODIUM 66-10 % PO SOLN
15.0000 mL | ORAL | Status: AC
  Administered 2016-05-22: 15 mL via ORAL

## 2016-05-22 MED ORDER — FERROUS SULFATE 325 (65 FE) MG PO TABS
325.0000 mg | ORAL_TABLET | Freq: Two times a day (BID) | ORAL | Status: DC
  Administered 2016-05-22 – 2016-05-24 (×4): 325 mg via ORAL
  Filled 2016-05-22 (×4): qty 1

## 2016-05-22 MED ORDER — SODIUM CHLORIDE 0.9 % IV BOLUS (SEPSIS)
1000.0000 mL | Freq: Once | INTRAVENOUS | Status: AC
  Administered 2016-05-22: 1000 mL via INTRAVENOUS

## 2016-05-22 MED ORDER — ONDANSETRON HCL 4 MG/2ML IJ SOLN
4.0000 mg | Freq: Once | INTRAMUSCULAR | Status: AC
  Administered 2016-05-22: 4 mg via INTRAVENOUS
  Filled 2016-05-22: qty 2

## 2016-05-22 MED ORDER — IOPAMIDOL (ISOVUE-300) INJECTION 61%
100.0000 mL | Freq: Once | INTRAVENOUS | Status: AC | PRN
  Administered 2016-05-22: 100 mL via INTRAVENOUS

## 2016-05-22 MED ORDER — SODIUM CHLORIDE 0.9 % IV BOLUS (SEPSIS): 500.0000 mL | Freq: Once | INTRAVENOUS | Status: DC

## 2016-05-22 MED ORDER — IBUPROFEN 600 MG PO TABS
600.0000 mg | ORAL_TABLET | Freq: Four times a day (QID) | ORAL | Status: DC | PRN
  Administered 2016-05-22 – 2016-05-23 (×2): 600 mg via ORAL
  Filled 2016-05-22 (×3): qty 1

## 2016-05-22 MED ORDER — DOCUSATE SODIUM 100 MG PO CAPS: 100.0000 mg | ORAL_CAPSULE | Freq: Two times a day (BID) | ORAL | Status: DC | PRN

## 2016-05-22 MED ORDER — DEXTROSE 5 % IV SOLN
1.0000 g | INTRAVENOUS | Status: DC
  Administered 2016-05-22 – 2016-05-23 (×2): 1 g via INTRAVENOUS
  Filled 2016-05-22 (×3): qty 10

## 2016-05-22 MED ORDER — SODIUM CHLORIDE 0.9 % IV BOLUS (SEPSIS): 1000.0000 mL | Freq: Once | INTRAVENOUS | Status: DC

## 2016-05-22 MED ORDER — MORPHINE SULFATE (PF) 4 MG/ML IV SOLN
4.0000 mg | Freq: Once | INTRAVENOUS | Status: AC
  Administered 2016-05-22: 4 mg via INTRAVENOUS
  Filled 2016-05-22: qty 1

## 2016-05-22 MED ORDER — IBUPROFEN 600 MG PO TABS
600.0000 mg | ORAL_TABLET | Freq: Four times a day (QID) | ORAL | Status: DC
  Administered 2016-05-22: 600 mg via ORAL
  Filled 2016-05-22: qty 1

## 2016-05-22 NOTE — Progress Notes (Signed)
Pt arrived on unit. VSS. Pt is alert and oriented. Skin assessment completed with Marliss Czar, RN. Breast pump is present at the bedside from mother baby.   Karsten Ro

## 2016-05-22 NOTE — ED Triage Notes (Signed)
c section 11 days ago, not feeling well x 2 days, chills, today noted fever

## 2016-05-22 NOTE — H&P (Signed)
Jenny Delgado is an 25 y.o. female.   Chief Complaint: Fever HPI: Presents with 1 day history of fever and chills. No abdomenal pain. Mild soreness that has improved post op. Felt nauseated 2 days ago then better. Developed fever and chills last night.  Past Medical History:  Diagnosis Date  . Anxiety   . Depression   . Headache(784.0)   . Hypertension   . IUD contraception    mirena    Past Surgical History:  Procedure Laterality Date  . CESAREAN SECTION  2008  . CESAREAN SECTION N/A 05/11/2016   Procedure: REPEAT CESAREAN SECTION;  Surgeon: [name], MD;  Location: [facility] ORS;  Service: Obstetrics;  Laterality: N/A;  . WISDOM TOOTH EXTRACTION      Family History  Problem Relation Age of Onset  . Hypertension Father   . Diabetes Maternal Grandfather   . Breast cancer Neg Hx   . Cervical cancer Neg Hx   . Colon cancer Neg Hx   . Ovarian cancer Neg Hx   . Heart disease Neg Hx    Social History:  reports that she has never smoked. She has never used smokeless tobacco. She reports that she drinks alcohol. She reports that she does not use drugs.  Allergies:  Allergies  Allergen Reactions  . Bactrim [Sulfamethoxazole-Trimethoprim] Hives, Rash and Other (See Comments)    Patient states she had a fever.  Loma Sousa [Escitalopram Oxalate] Other (See Comments)    Facial numbness  . Zoloft [Sertraline Hcl] Other (See Comments)    Facial numbness     (Not in a hospital admission)  Results for orders placed or performed during the hospital encounter of 05/22/16 (from the past 48 hour(s))  Lactic acid, plasma     Status: None   Collection Time: 05/22/16 10:34 AM  Result Value Ref Range   Lactic Acid, Venous 1.0 0.5 - 1.9 mmol/L  Comprehensive metabolic panel     Status: Abnormal   Collection Time: 05/22/16 10:34 AM  Result Value Ref Range   Sodium 139 135 - 145 mmol/L   Potassium 3.4 (L) 3.5 - 5.1 mmol/L   Chloride 108 101 - 111 mmol/L   CO2 23 22 - 32 mmol/L    Glucose, Bld 108 (H) 65 - 99 mg/dL   BUN 8 6 - 20 mg/dL   Creatinine, Ser 0.62 0.44 - 1.00 mg/dL   Calcium 8.7 (L) 8.9 - 10.3 mg/dL   Total Protein 7.2 6.5 - 8.1 g/dL   Albumin 3.5 3.5 - 5.0 g/dL   AST 21 15 - 41 U/L   ALT 33 14 - 54 U/L   Alkaline Phosphatase 101 38 - 126 U/L   Total Bilirubin 0.3 0.3 - 1.2 mg/dL   GFR calc non Af Amer >60 >60 mL/min   GFR calc Af Amer >60 >60 mL/min    Comment: (NOTE) The eGFR has been calculated using the CKD EPI equation. This calculation has not been validated in all clinical situations. eGFR's persistently <60 mL/min signify possible Chronic Kidney Disease.    Anion gap 8 5 - 15  CBC WITH DIFFERENTIAL     Status: Abnormal   Collection Time: 05/22/16 10:34 AM  Result Value Ref Range   WBC 10.2 3.6 - 11.0 K/uL   RBC 4.36 3.80 - 5.20 MIL/uL   Hemoglobin 11.3 (L) 12.0 - 16.0 g/dL   HCT 33.1 (L) 35.0 - 47.0 %   MCV 75.9 (L) 80.0 - 100.0 fL   MCH 26.0 26.0 -  34.0 pg   MCHC 34.3 32.0 - 36.0 g/dL   RDW 79.9 (H) 80.0 - 12.3 %   Platelets 275 150 - 440 K/uL   Neutrophils Relative % 87 %   Neutro Abs 8.9 (H) 1.4 - 6.5 K/uL   Lymphocytes Relative 9 %   Lymphs Abs 0.9 (L) 1.0 - 3.6 K/uL   Monocytes Relative 4 %   Monocytes Absolute 0.4 0.2 - 0.9 K/uL   Eosinophils Relative 0 %   Eosinophils Absolute 0.0 0 - 0.7 K/uL   Basophils Relative 0 %   Basophils Absolute 0.0 0 - 0.1 K/uL  Urinalysis complete, with microscopic ([facility] only)     Status: Abnormal   Collection Time: 05/22/16 11:56 AM  Result Value Ref Range   Color, Urine STRAW (A) YELLOW   APPearance HAZY (A) CLEAR   Glucose, UA NEGATIVE NEGATIVE mg/dL   Bilirubin Urine NEGATIVE NEGATIVE   Ketones, ur NEGATIVE NEGATIVE mg/dL   Specific Gravity, Urine 1.006 1.005 - 1.030   Hgb urine dipstick 1+ (A) NEGATIVE   pH 7.0 5.0 - 8.0   Protein, ur NEGATIVE NEGATIVE mg/dL   Nitrite NEGATIVE NEGATIVE   Leukocytes, UA 3+ (A) NEGATIVE   RBC / HPF 0-5 0 - 5 RBC/hpf   WBC, UA TOO NUMEROUS TO COUNT  0 - 5 WBC/hpf   Bacteria, UA NONE SEEN NONE SEEN   Squamous Epithelial / LPF 0-5 (A) NONE SEEN   Dg Chest 2 View  Result Date: 05/22/2016 CLINICAL DATA:  C-section 11 days prior.  Fever. EXAM: CHEST  2 VIEW COMPARISON:  None. FINDINGS: Normal mediastinum and cardiac silhouette. Normal pulmonary vasculature. No evidence of effusion, infiltrate, or pneumothorax. No acute bony abnormality. IMPRESSION: No acute cardiopulmonary process. Electronically Signed   By: Genevive Bi M.D.   On: 05/22/2016 11:39   Ct Abdomen Pelvis W Contrast  Result Date: 05/22/2016 CLINICAL DATA:  Status post C-section 11 days ago.  Fever. EXAM: CT ABDOMEN AND PELVIS WITH CONTRAST TECHNIQUE: Multidetector CT imaging of the abdomen and pelvis was performed using the standard protocol following bolus administration of intravenous contrast. CONTRAST:  ISOVUE-300 IOPAMIDOL (ISOVUE-300) INJECTION 61% COMPARISON:  None. FINDINGS: Normal lung bases. No free air. There is a small amount of free fluid in the pelvis. There is also a small amount of fluid in the right pericolic gutter adjacent to the appendix. The appendix however is normal in caliber measuring 4 mm with no definitive periappendiceal stranding. Liver, portal vein, gallbladder, spleen, adrenal glands, and pancreas are normal. The abdominal aorta is normal in caliber. No adenopathy. The stomach and small bowel are normal. The colon is normal in appearance. The appendix is described above. No adenopathy. The uterus is enlarged consistent with recent pregnancy. No gas seen within the uterus. No definitive evidence of infection. The ovaries are symmetric and unremarkable. The bladder is normal. Postoperative changes are seen in the lower abdominal wall from recent C-section. No other abnormalities seen in the pelvis. Delayed images demonstrate no filling defects in the upper renal collecting systems. No filling defects are seen in either ureter. The ureters are intact from  the kidneys to the bladder. No acute bony abnormalities. IMPRESSION: 1. There is a small amount of fluid adjacent to the appendix. This is probably fluid in the right pericolic gutter which is tracks superiorly from the pelvis. There is no convincing evidence of acute appendicitis. If there is high concern, a short-term follow-up could be performed. 2. There is a small  amount of fluid in the pelvis which is not unexpected after recent C-section. No abscess identified. Electronically Signed   By: Dorise Bullion III M.D   On: 05/22/2016 15:41    Review of Systems  Constitutional: Positive for chills and fever.  HENT: Negative for hearing loss.   Eyes: Negative for blurred vision.  Respiratory: Negative for cough and shortness of breath.   Cardiovascular: Negative for chest pain and leg swelling.  Gastrointestinal: Positive for nausea. Negative for vomiting.  Genitourinary: Negative for dysuria.  Musculoskeletal: Negative for joint pain.  Skin: Negative for rash.  Neurological: Negative for sensory change.    Blood pressure 135/74, pulse 76, temperature (!) 102.2 F (39 C), temperature source Oral, resp. rate 17, height '5\' 3"'$  (1.6 m), weight 76.7 kg (169 lb), last menstrual period 08/12/2015, SpO2 98 %, currently breastfeeding. Physical Exam  Constitutional: She is oriented to person, place, and time. She appears well-developed and well-nourished. No distress.  HENT:  Head: Normocephalic.  Mouth/Throat: Oropharynx is clear and moist. No oropharyngeal exudate.  Eyes: Pupils are equal, round, and reactive to light. Left eye exhibits no discharge. No scleral icterus.  Neck: Neck supple. No JVD present. No tracheal deviation present. No thyromegaly present.  Cardiovascular: Normal rate.   Murmur heard. Respiratory: Effort normal. No respiratory distress. She exhibits no tenderness.  GI: Soft.  Suprapubic healing incisional scar with some steri strips still attached. No exudate or tenderness.   Musculoskeletal: Normal range of motion. She exhibits no edema or tenderness.  Lymphadenopathy:    She has no cervical adenopathy.  Neurological: She is alert and oriented to person, place, and time. No cranial nerve deficit.  Skin: Skin is warm and dry. No erythema.     Assessment/Plan 1. Sepsis: Fever and tachycardia. Likely secondary to UTI. Supportive care and treat underlying cause.  2. UTI: On rocephin. Culture pending.  3. Hypokalemia: Added K to IVF. Recheck in am.  4. S/P C- section: Dr Marcelline Mates is OB/GYN. Last seen on Thursday. No sign of post op wound infection on exam or CT scan.  Time spent= 25 min  Baxter Hire, MD 05/22/2016, 4:28 PM

## 2016-05-22 NOTE — Progress Notes (Signed)
   OBSTETRIC POST-OPERATIVE CLINIC VISIT  Subjective:     Jenny Delgado is a 25 y.o. [phone] female who presents to the clinic 10 days status post repeat C-section for h/o prior C-section x 1, declining TOLAC. Eating a regular diet without difficulty. Bowel movements are normal. Pain is controlled with current analgesics. Medications being used: prescription NSAID's including ibuprofen (Motrin) and narcotic analgesics including oxycodone/acetaminophen (Percocet, Tylox).  Notes that left groin pain is improving.  Needs refill on pain medication (Percocet).  Is also using cold compresses to groin.   The following portions of the patient's history were reviewed and updated as appropriate: allergies, current medications, past family history, past medical history, past social history, past surgical history and problem list.  Review of Systems Pertinent items noted in HPI and remainder of comprehensive ROS otherwise negative.    Objective:    BP 130/76   Pulse 70   Ht 5\' 3"  (1.6 m)   Wt 169 lb 9.6 oz (76.9 kg)   LMP 08/12/2015 (Exact Date)   Breastfeeding? Yes   BMI 30.04 kg/m  General:  alert and no distress  Abdomen: soft, bowel sounds active, non-tender  Incision:   healing well, no drainage, no erythema, no hernia, no seroma, no swelling, no dehiscence, incision well approximated.  Steri-strips in place.     Assessment:    Doing well postoperatively. S/p repeat C-section. Left groin pain improving since discharge from hospital.    Plan:    1.Continue any current medications.  Refill given on Percocet.  2. Wound care discussed. 3. Activity restrictions: no bending, stooping, or squatting and no lifting more than 15 pounds 4. Anticipated return to work: 5 weeks. 5. Follow up: 5 weeks for postpartum visit.     Hildred Laser, MD [facility]

## 2016-05-22 NOTE — ED Notes (Signed)
Pt has fever. Asked MD for order for tylenol. MD does not want to give tylenol.

## 2016-05-22 NOTE — ED Provider Notes (Signed)
[facility] Emergency Department Provider Note  ____________________________________________  Seen at approximately 11:40 AM  I have reviewed the triage vital signs and the nursing notes.   HISTORY  Chief Complaint Fever    HPI Jenny Delgado is a 25 y.o. female with a history of headaches as well as depression who is presenting to the emergency department today with a fever as well as lower abdominal pain. She had a C-section on 05/11/2016. She has been experiencing lower abdominal pain ever since and says that it is actually improved. She says that her pain is around the area of her surgical incision and has been cramping and nonradiating. She says that Dr. Valentino Saxon was her OB/GYN who did the C-section and that Dr. Valentino Saxon recently renewed the patient's prescription for Percocet because of the continued pain. The patient says that she has brownish colored vaginal discharge which has been decreasing since the surgery. She denies any burning with urination but says that she does have increased urinary frequency. She says that she is also complaining of a 7 out of 10 frontal headache that has been worsening over the past day. There was no sudden onset thunderclap in quality. The patient is breast-feeding. She says that she also has been around her father who has had chills. Does not report any definitive fever and her father. The patient denies any cough, runny nose, nausea or vomiting or diarrhea.   Past Medical History:  Diagnosis Date  . Anxiety   . Depression   . Headache(784.0)   . Hypertension   . IUD contraception    mirena    Patient Active Problem List   Diagnosis Date Noted  . S/P cesarean section 05/11/2016  . Gastroesophageal reflux in pregancy in third trimester 12/30/2015  . Supervision of normal pregnancy in third trimester 12/03/2015  . 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 Previous cesarean  delivery, antepartum 10/01/2015  . Miscarriage within last 12 months 10/01/2015  . H/O abnormal cervical Papanicolaou smear 03/27/2015  . Anxiety and depression 03/27/2015    Past Surgical History:  Procedure Laterality Date  . CESAREAN SECTION  2008  . CESAREAN SECTION N/A 05/11/2016   Procedure: REPEAT CESAREAN SECTION;  Surgeon: Hildred Laser, MD;  Location: [facility] ORS;  Service: Obstetrics;  Laterality: N/A;  . WISDOM TOOTH EXTRACTION      Prior to Admission medications   Medication Sig Start Date End Date Taking? Authorizing Provider  docusate sodium (COLACE) 100 MG capsule Take 1 capsule (100 mg total) by mouth 2 (two) times daily as needed for mild constipation. 05/14/16  Yes Hildred Laser, MD  ferrous sulfate 325 (65 FE) MG tablet Take 1 tablet (325 mg total) by mouth 2 (two) times daily with a meal. 05/14/16  Yes Hildred Laser, MD  ibuprofen (ADVIL,MOTRIN) 600 MG tablet Take 1 tablet (600 mg total) by mouth every 6 (six) hours. 05/14/16  Yes Hildred Laser, MD  oxyCODONE-acetaminophen (PERCOCET/ROXICET) 5-325 MG tablet Take 1-2 tablets by mouth every 6 (six) hours as needed (pain scale 4-7). 05/20/16  Yes Hildred Laser, MD    Allergies Bactrim [sulfamethoxazole-trimethoprim]; Lexapro [escitalopram oxalate]; and Zoloft [sertraline hcl]  Family History  Problem Relation Age of Onset  . Hypertension Father   . Diabetes Maternal Grandfather   . Breast cancer Neg Hx   . Cervical cancer Neg Hx   . Colon cancer Neg Hx   . Ovarian cancer Neg Hx   . Heart disease Neg Hx  Social History Social History  Substance Use Topics  . Smoking status: Never Smoker  . Smokeless tobacco: Never Used  . Alcohol use Yes     Comment: ocassional wine coolers     Review of Systems Constitutional: No fever/chills Eyes: No visual changes. ENT: No sore throat. Cardiovascular: Denies chest pain. Respiratory: Denies shortness of breath. Gastrointestinal:  No nausea, no vomiting.  No diarrhea.  No  constipation. Genitourinary: Negative for dysuria. Musculoskeletal: Mild soreness to the low lumbar area across the low lumbar region. Skin: Negative for rash. Neurological: Negative for focal weakness or numbness.  10-point ROS otherwise negative.  ____________________________________________   PHYSICAL EXAM:  VITAL SIGNS: ED Triage Vitals  Enc Vitals Group     BP 05/22/16 1019 (!) 151/88     Pulse Rate 05/22/16 1019 95     Resp 05/22/16 1019 18     Temp 05/22/16 1019 (!) 101.8 F (38.8 C)     Temp Source 05/22/16 1019 Oral     SpO2 05/22/16 1019 97 %     Weight 05/22/16 1019 169 lb (76.7 kg)     Height 05/22/16 1019 5\' 3"  (1.6 m)     Head Circumference --      Peak Flow --      Pain Score 05/22/16 1020 4     Pain Loc --      Pain Edu? --      Excl. in GC? --     Constitutional: Alert and oriented. Well appearing and in no acute distress. Eyes: Conjunctivae are normal. PERRL. EOMI. Head: Atraumatic. Nose: No congestion/rhinnorhea. Mouth/Throat: Mucous membranes are moist.  Oropharynx non-erythematous. Neck: No stridor.   Cardiovascular: Tachycardic, regular rhythm. Grossly normal heart sounds.  Good peripheral circulation. Respiratory: Normal respiratory effort.  No retractions. Lungs CTAB. Gastrointestinal: Soft with tenderness over the right half of the incision site. There is no dehiscence, erythema or pus within or surrounding the incision. No fluctuance. No abdominal bruits. No CVA tenderness. Musculoskeletal: No lower extremity tenderness nor edema.  No joint effusions. Mild tenderness to the lower lumbar region across the lower lumbar region. There is no deformity or step-off to the midline lumbar spine. Neurologic:  Normal speech and language. No gross focal neurologic deficits are appreciated. No gait instability. Skin:  Skin is warm, dry and intact. No rash noted. Psychiatric: Mood and affect are normal. Speech and behavior are  normal.  ____________________________________________   LABS (all labs ordered are listed, but only abnormal results are displayed)  Labs Reviewed  COMPREHENSIVE METABOLIC PANEL - Abnormal; Notable for the following:       Result Value   Potassium 3.4 (*)    Glucose, Bld 108 (*)    Calcium 8.7 (*)    All other components within normal limits  CBC WITH DIFFERENTIAL/PLATELET - Abnormal; Notable for the following:    Hemoglobin 11.3 (*)    HCT 33.1 (*)    MCV 75.9 (*)    RDW 16.1 (*)    Neutro Abs 8.9 (*)    Lymphs Abs 0.9 (*)    All other components within normal limits  URINALYSIS COMPLETEWITH MICROSCOPIC ([facility] ONLY) - Abnormal; Notable for the following:    Color, Urine STRAW (*)    APPearance HAZY (*)    Hgb urine dipstick 1+ (*)    Leukocytes, UA 3+ (*)    Squamous Epithelial / LPF 0-5 (*)    All other components within normal limits  CULTURE, BLOOD (ROUTINE X 2)  CULTURE, BLOOD (ROUTINE X 2)  URINE CULTURE  LACTIC ACID, PLASMA   ____________________________________________  EKG  ED ECG REPORT I, Schaevitz,  Teena Irani, the attending physician, personally viewed and interpreted this ECG.   Date: 05/22/2016  EKG Time: 1034  Rate: 113  Rhythm: sinus tachycardia  Axis: Normal axis  Intervals:none  ST&T Change: No ST segment elevation or depression. No abnormal T-wave inversion.  ____________________________________________  RADIOLOGY  CLINICAL DATA:  C-section 11 days prior.  Fever.  EXAM: CHEST  2 VIEW  COMPARISON:  None.  FINDINGS: Normal mediastinum and cardiac silhouette. Normal pulmonary vasculature. No evidence of effusion, infiltrate, or pneumothorax. No acute bony abnormality.  IMPRESSION: No acute cardiopulmonary process.   Electronically Signed   By: Genevive Bi M.D.   On: 05/22/2016 11:39  CT Abdomen Pelvis W Contrast (Accession [id number]) (Order [id number])  Imaging  Date: 05/22/2016 Department: [facility] EMERGENCY DEPARTMENT Released By/Authorizing: Myrna Blazer, MD (auto-released)  PACS Images   Show images for CT Abdomen Pelvis W Contrast  Study Result   CLINICAL DATA:  Status post C-section 11 days ago.  Fever.  EXAM: CT ABDOMEN AND PELVIS WITH CONTRAST  TECHNIQUE: Multidetector CT imaging of the abdomen and pelvis was performed using the standard protocol following bolus administration of intravenous contrast.  CONTRAST:  ISOVUE-300 IOPAMIDOL (ISOVUE-300) INJECTION 61%  COMPARISON:  None.  FINDINGS: Normal lung bases.  No free air. There is a small amount of free fluid in the pelvis. There is also a small amount of fluid in the right pericolic gutter adjacent to the appendix. The appendix however is normal in caliber measuring 4 mm with no definitive periappendiceal stranding. Liver, portal vein, gallbladder, spleen, adrenal glands, and pancreas are normal. The abdominal aorta is normal in caliber. No adenopathy. The stomach and small bowel are normal. The colon is normal in appearance. The appendix is described above.  No adenopathy. The uterus is enlarged consistent with recent pregnancy. No gas seen within the uterus. No definitive evidence of infection. The ovaries are symmetric and unremarkable. The bladder is normal. Postoperative changes are seen in the lower abdominal wall from recent C-section. No other abnormalities seen in the pelvis.  Delayed images demonstrate no filling defects in the upper renal collecting systems. No filling defects are seen in either ureter. The ureters are intact from the kidneys to the bladder.  No acute bony abnormalities.  IMPRESSION: 1. There is a small amount of fluid adjacent to the appendix. This is probably fluid in the right pericolic gutter which is tracks superiorly from the pelvis. There is no convincing evidence of acute appendicitis. If there is high concern, a short-term follow-up  could be performed. 2. There is a small amount of fluid in the pelvis which is not unexpected after recent C-section. No abscess identified.   Electronically Signed   By: Gerome Sam III M.D   On: 05/22/2016 15:41     ____ ________________________________________   PROCEDURES  Procedure(s) performed:   Procedures  Critical Care performed:   ____________________________________________   INITIAL IMPRESSION / ASSESSMENT AND PLAN / ED COURSE  Pertinent labs & imaging results that were available during my care of the patient were reviewed by me and considered in my medical decision making (see chart for details).  ----------------------------------------- 12:01 PM on 05/22/2016 -----------------------------------------  I discussed the case with Dr. Valentino Saxon who is familiar with the patient and says that she suspects nerve pain that has been ongoing since the  surgery that is greater than the right lower quadrant abdominal pain. However, in the context of the fever she does agree with a CAT scan to rule out any abscess or other surgical complication.  ----------------------------------------- 3:58 PM on 05/22/2016 -----------------------------------------  Patient saying her pain has returned. Also her temperature has increased now to 102 after fluids and antibiotics. Her labs are reassuring but with a worsening temperature I feel that admission is the more appropriate course of action at this time. I discussed this with the patient as well as the admitting doctor, Dr. Letitia Libra who is accepted the patient. The patient is understanding of the plan and willing to comply. Furthermore, I also discussed the CAT scan read with Dr. Tonita Cong who does not believe that the CAT scan is consistent with appendicitis. He says that the appendix fills with contrast and that the amount of fluid surrounding the appendix is small. The pain is likely persistent from the patient's C-section. The  patient is breast-feeding and I recommended that she pump when she is not feeling the effects of the morphine which she did. We will now give her another dose of morphine.  Clinical Course  Value Comment By Time  Appearance: (!) HAZY (Reviewed) Myrna Blazer, MD 08/05 1221     ____________________________________________   FINAL CLINICAL IMPRESSION(S) / ED DIAGNOSES  UTI. Sepsis.    NEW MEDICATIONS STARTED DURING THIS VISIT:  New Prescriptions   No medications on file     Note:  This document was prepared using Dragon voice recognition software and may include unintentional dictation errors.    Myrna Blazer, MD 05/22/16 1600

## 2016-05-23 LAB — BASIC METABOLIC PANEL
ANION GAP: 7 (ref 5–15)
BUN: 9 mg/dL (ref 6–20)
CALCIUM: 8.3 mg/dL — AB (ref 8.9–10.3)
CO2: 23 mmol/L (ref 22–32)
CREATININE: 0.5 mg/dL (ref 0.44–1.00)
Chloride: 111 mmol/L (ref 101–111)
Glucose, Bld: 92 mg/dL (ref 65–99)
Potassium: 3.6 mmol/L (ref 3.5–5.1)
Sodium: 141 mmol/L (ref 135–145)

## 2016-05-23 NOTE — Progress Notes (Signed)
[facility] - [place] at [facility]   PATIENT NAME: Jenny Delgado    MR#:  [id number]  DATE OF BIRTH:  Aug 27, 1991  SUBJECTIVE:  CHIEF COMPLAINT: Pt is doing fine, denies any nausea or vomiting, denies chills. Constipated, lactating.  REVIEW OF SYSTEMS:  CONSTITUTIONAL: No fever, fatigue or weakness.  EYES: No blurred or double vision.  EARS, NOSE, AND THROAT: No tinnitus or ear pain.  RESPIRATORY: No cough, shortness of breath, wheezing or hemoptysis.  CARDIOVASCULAR: No chest pain, orthopnea, edema.  GASTROINTESTINAL: No nausea, vomiting, diarrhea or abdominal pain.  GENITOURINARY: No dysuria, hematuria.  ENDOCRINE: No polyuria, nocturia,  HEMATOLOGY: No anemia, easy bruising or bleeding SKIN: No rash or lesion. MUSCULOSKELETAL: No joint pain or arthritis.   NEUROLOGIC: No tingling, numbness, weakness.  PSYCHIATRY: No anxiety or depression.   DRUG ALLERGIES:   Allergies  Allergen Reactions  . Bactrim [Sulfamethoxazole-Trimethoprim] Hives, Rash and Other (See Comments)    Patient states she had a fever.  Judye Bos [Escitalopram Oxalate] Other (See Comments)    Facial numbness  . Zoloft [Sertraline Hcl] Other (See Comments)    Facial numbness    VITALS:  Blood pressure 125/69, pulse 88, temperature 98.9 F (37.2 C), temperature source Oral, resp. rate 18, height  (1.6 m), weight 77.1 kg (170 lb), last menstrual period 08/12/2015, SpO2 99 %, currently breastfeeding.  PHYSICAL EXAMINATION:  GENERAL:  25 y.o.-year-old patient lying in the bed with no acute distress.  EYES: Pupils equal, round, reactive to light and accommodation. No scleral icterus. Extraocular muscles intact.  HEENT: Head atraumatic, normocephalic. Oropharynx and nasopharynx clear.  NECK:  Supple, no jugular venous distention. No thyroid enlargement, no tenderness.  LUNGS: Normal breath sounds bilaterally, no wheezing, rales,rhonchi or crepitation. No use of accessory  muscles of respiration.  CARDIOVASCULAR: S1, S2 normal. No murmurs, rubs, or gallops.  ABDOMEN: Soft, nontender, nondistended. Bowel sounds present. No organomegaly or mass. CS scar healing well EXTREMITIES: No pedal edema, cyanosis, or clubbing.  NEUROLOGIC: Cranial nerves II through XII are intact. Muscle strength 5/5 in all extremities. Sensation intact. Gait not checked.  PSYCHIATRIC: The patient is alert and oriented x 3.  SKIN: No obvious rash, lesion, or ulcer.    LABORATORY PANEL:   CBC  Recent Labs Lab 05/22/16 1034  WBC 10.2  HGB 11.3*  HCT 33.1*  PLT 275   ------------------------------------------------------------------------------------------------------------------  Chemistries   Recent Labs Lab 05/22/16 1034 05/23/16 0446  NA 139 141  K 3.4* 3.6  CL 108 111  CO2 23 23  GLUCOSE 108* 92  BUN 8 9  CREATININE 0.62 0.50  CALCIUM 8.7* 8.3*  AST 21  --   ALT 33  --   ALKPHOS 101  --   BILITOT 0.3  --    ------------------------------------------------------------------------------------------------------------------  Cardiac Enzymes No results for input(s): TROPONINI in the last 168 hours. ------------------------------------------------------------------------------------------------------------------  RADIOLOGY:  Dg Chest 2 View  Result Date: 05/22/2016 CLINICAL DATA:  C-section 11 days prior.  Fever. EXAM: CHEST  2 VIEW COMPARISON:  None. FINDINGS: Normal mediastinum and cardiac silhouette. Normal pulmonary vasculature. No evidence of effusion, infiltrate, or pneumothorax. No acute bony abnormality. IMPRESSION: No acute cardiopulmonary process. Electronically Signed   By: Genevive Bi M.D.   On: 05/22/2016 11:39   Ct Abdomen Pelvis W Contrast  Result Date: 05/22/2016 CLINICAL DATA:  Status post C-section 11 days ago.  Fever. EXAM: CT ABDOMEN AND PELVIS WITH CONTRAST TECHNIQUE: Multidetector CT imaging of the abdomen and pelvis was  performed using  the standard protocol following bolus administration of intravenous contrast. CONTRAST:  100mL ISOVUE-300 IOPAMIDOL (ISOVUE-300) INJECTION 61% COMPARISON:  None. FINDINGS: Normal lung bases. No free air. There is a small amount of free fluid in the pelvis. There is also a small amount of fluid in the right pericolic gutter adjacent to the appendix. The appendix however is normal in caliber measuring 4 mm with no definitive periappendiceal stranding. Liver, portal vein, gallbladder, spleen, adrenal glands, and pancreas are normal. The abdominal aorta is normal in caliber. No adenopathy. The stomach and small bowel are normal. The colon is normal in appearance. The appendix is described above. No adenopathy. The uterus is enlarged consistent with recent pregnancy. No gas seen within the uterus. No definitive evidence of infection. The ovaries are symmetric and unremarkable. The bladder is normal. Postoperative changes are seen in the lower abdominal wall from recent C-section. No other abnormalities seen in the pelvis. Delayed images demonstrate no filling defects in the upper renal collecting systems. No filling defects are seen in either ureter. The ureters are intact from the kidneys to the bladder. No acute bony abnormalities. IMPRESSION: 1. There is a small amount of fluid adjacent to the appendix. This is probably fluid in the right pericolic gutter which is tracks superiorly from the pelvis. There is no convincing evidence of acute appendicitis. If there is high concern, a short-term follow-up could be performed. 2. There is a small amount of fluid in the pelvis which is not unexpected after recent C-section. No abscess identified. Electronically Signed   By: Gerome Samavid  Williams III M.D   On: 05/22/2016 15:41    EKG:   Orders placed or performed during the hospital encounter of 05/22/16  . EKG 12-Lead  . EKG 12-Lead  . EKG    ASSESSMENT AND PLAN:   1. Sepsis: meets criteria with Fever and tachycardia.  Likely secondary to UTI.  Supportive care and treat underlying cause.  2. UTI:  On rocephin. Culture pending.  3. Hypokalemia replaced potasium, resolved  4. S/P C- section: Dr Valentino Saxonherry is OB/GYN. Last seen on Thursday. No sign of post op wound infection on exam or CT scan.  5. Constipation- colace and dulcolax. Prob safe during lactation, no enough human research     All the records are reviewed and case discussed with Care Management/Social Workerr. Management plans discussed with the patient, family and they are in agreement.  CODE STATUS: fc  TOTAL TIME TAKING CARE OF THIS PATIENT: 36 minutes.   POSSIBLE D/C IN 1-2 DAYS, DEPENDING ON CLINICAL CONDITION.  Note: This dictation was prepared with Dragon dictation along with smaller phrase technology. Any transcriptional errors that result from this process are unintentional.   Ramonita LabGouru, Justine Dines M.D on 05/23/2016 at 8:45 PM  Between 7am to 6pm - Pager - [phone] After 6pm go to [email or web address] - password [email or web address] [facility]  [facility]  Office  [phone]  CC: Primary care physician; Hildred LaserAnika Cherry, MD

## 2016-05-24 LAB — BASIC METABOLIC PANEL
ANION GAP: 6 (ref 5–15)
BUN: 13 mg/dL (ref 6–20)
CALCIUM: 8.5 mg/dL — AB (ref 8.9–10.3)
CO2: 22 mmol/L (ref 22–32)
CREATININE: 0.43 mg/dL — AB (ref 0.44–1.00)
Chloride: 112 mmol/L — ABNORMAL HIGH (ref 101–111)
GLUCOSE: 100 mg/dL — AB (ref 65–99)
Potassium: 4 mmol/L (ref 3.5–5.1)
Sodium: 140 mmol/L (ref 135–145)

## 2016-05-24 LAB — CBC
HEMATOCRIT: 29.4 % — AB (ref 35.0–47.0)
Hemoglobin: 9.9 g/dL — ABNORMAL LOW (ref 12.0–16.0)
MCH: 26.2 pg (ref 26.0–34.0)
MCHC: 33.6 g/dL (ref 32.0–36.0)
MCV: 78 fL — AB (ref 80.0–100.0)
PLATELETS: 218 10*3/uL (ref 150–440)
RBC: 3.77 MIL/uL — ABNORMAL LOW (ref 3.80–5.20)
RDW: 15.6 % — AB (ref 11.5–14.5)
WBC: 7.4 10*3/uL (ref 3.6–11.0)

## 2016-05-24 LAB — URINE CULTURE

## 2016-05-24 MED ORDER — AMOXICILLIN-POT CLAVULANATE 875-125 MG PO TABS: 1.0000 | ORAL_TABLET | Freq: Two times a day (BID) | ORAL | 0 refills | Status: DC

## 2016-05-24 NOTE — Discharge Instructions (Signed)
Activity as tolerated Diet regular Follow-up with [facility]ia Abed[facility]say primary care in 2-3 days to follow-up on the pending urine culture and sensitivity [facility]

## 2016-05-24 NOTE — Discharge Summary (Signed)
[facility] - [place] at [facility]   PATIENT NAME: Jenny Delgado    MR#:  [id number]  DATE OF BIRTH:  03/19/1991  DATE OF ADMISSION:  05/22/2016 ADMITTING PHYSICIAN: Gracelyn Nurse, MD  DATE OF DISCHARGE:  05/24/16 PRIMARY CARE PHYSICIAN: Hildred Laser, MD    ADMISSION DIAGNOSIS:  UTI (lower urinary tract infection) [N39.0] Sepsis, due to unspecified organism (HCC) [A41.9]  DISCHARGE DIAGNOSIS:  Active Problems:   Sepsis (HCC) UTI  SECONDARY DIAGNOSIS:   Past Medical History:  Diagnosis Date  . Anxiety   . Depression   . Headache(784.0)   . IUD contraception    mirena    HOSPITAL COURSE:  Please review history and physical for details  1. Sepsis: meets criteria with Fever and tachycardia at the time of admission ,secondary to UTI.  Supportive care and treat underlying cause.  2. UTI:  Given IV Rocephin during the hospital course. Clinically improved. Urine culture is reincubated and pending at this time. Patient is nursing her newborn baby and wants to go home with oral antibiotics. Will discharge patient home with by mouth Augmentin while the urine cultures pending. Patient will follow-up with primary care physician at [facility] primary care in 2-3 days to follow-up on the urine culture final results  3. Hypokalemia replaced potasium, resolved  4. S/P C- section: Dr Valentino Saxon is OB/GYN. Last seen on Thursday. No sign of post op wound infection on exam or CT scan.  5. Constipation- colace and dulcolax. Prob safe during lactation, no enough human research    DISCHARGE CONDITIONS:   FAIR  CONSULTS OBTAINED:     PROCEDURES  NONE  DRUG ALLERGIES:   Allergies  Allergen Reactions  . Bactrim [Sulfamethoxazole-Trimethoprim] Hives, Rash and Other (See Comments)    Patient states she had a fever.  Judye Bos [Escitalopram Oxalate] Other (See Comments)    Facial numbness  . Zoloft [Sertraline Hcl] Other (See Comments)    Facial  numbness    DISCHARGE MEDICATIONS:   Current Discharge Medication List    START taking these medications   Details  amoxicillin-clavulanate (AUGMENTIN) 875-125 MG tablet Take 1 tablet by mouth 2 (two) times daily. Qty: 10 tablet, Refills: 0      CONTINUE these medications which have NOT CHANGED   Details  docusate sodium (COLACE) 100 MG capsule Take 1 capsule (100 mg total) by mouth 2 (two) times daily as needed for mild constipation. Qty: 60 capsule, Refills: 2    ferrous sulfate 325 (65 FE) MG tablet Take 1 tablet (325 mg total) by mouth 2 (two) times daily with a meal. Qty: 60 tablet, Refills: 2    ibuprofen (ADVIL,MOTRIN) 600 MG tablet Take 1 tablet (600 mg total) by mouth every 6 (six) hours. Qty: 30 tablet, Refills: 0    oxyCODONE-acetaminophen (PERCOCET/ROXICET) 5-325 MG tablet Take 1-2 tablets by mouth every 6 (six) hours as needed (pain scale 4-7). Qty: 30 tablet, Refills: 0         DISCHARGE INSTRUCTIONS:   Activity as tolerated Diet regular Follow-up with Lillia Abed primary care in 2-3 days to follow-up on the pending urine culture and sensitivity Follow-up with OB and GYN doctor [facility] as scheduled  DIET:  Regular diet  DISCHARGE CONDITION:  Fair  ACTIVITY:  Activity as tolerated  OXYGEN:  Home Oxygen: No.   Oxygen Delivery: room air  DISCHARGE LOCATION:  home   If you experience worsening of your admission symptoms, develop shortness of breath, life threatening emergency, suicidal or  homicidal thoughts you must seek medical attention immediately by calling 911 or calling your MD immediately  if symptoms less severe.  You Must read complete instructions/literature along with all the possible adverse reactions/side effects for all the Medicines you take and that have been prescribed to you. Take any new Medicines after you have completely understood and accpet all the possible adverse reactions/side effects.   Please note  You were cared for by a  hospitalist during your hospital stay. If you have any questions about your discharge medications or the care you received while you were in the hospital after you are discharged, you can call the unit and asked to speak with the hospitalist on call if the hospitalist that took care of you is not available. Once you are discharged, your primary care physician will handle any further medical issues. Please note that NO REFILLS for any discharge medications will be authorized once you are discharged, as it is imperative that you return to your primary care physician (or establish a relationship with a primary care physician if you do not have one) for your aftercare needs so that they can reassess your need for medications and monitor your lab values.     Today  Chief Complaint  Patient presents with  . Fever   Patient is afebrile in the past 24 hours. Doing fine. Denies any abdominal pain and wants to go home. Mom at bedside. Constipated wants to continue Colace as an outpatient  ROS:  CONSTITUTIONAL: Denies fevers, chills. Denies any fatigue, weakness.  EYES: Denies blurry vision, double vision, eye pain. EARS, NOSE, THROAT: Denies tinnitus, ear pain, hearing loss. RESPIRATORY: Denies cough, wheeze, shortness of breath.  CARDIOVASCULAR: Denies chest pain, palpitations, edema.  GASTROINTESTINAL: Denies nausea, vomiting, diarrhea, abdominal pain. Denies bright red blood per rectum. had C-section recently  GENITOURINARY: Denies dysuria, hematuria. ENDOCRINE: Denies nocturia or thyroid problems. HEMATOLOGIC AND LYMPHATIC: Denies easy bruising or bleeding. SKIN: Denies rash or lesion. MUSCULOSKELETAL: Denies pain in neck, back, shoulder, knees, hips or arthritic symptoms.  NEUROLOGIC: Denies paralysis, paresthesias.  PSYCHIATRIC: Denies anxiety or depressive symptoms.   VITAL SIGNS:  Blood pressure (!) 117/57, pulse (!) 50, temperature 97.8 F (36.6 C), temperature source Oral, resp. rate  18, height 5\' 3"  (1.6 m), weight 77.1 kg (170 lb), last menstrual period 08/12/2015, SpO2 98 %, currently breastfeeding.  I/O:    Intake/Output Summary (Last 24 hours) at 05/24/16 1117 Last data filed at 05/24/16 0900  Gross per 24 hour  Intake          2430.66 ml  Output                0 ml  Net          2430.66 ml    PHYSICAL EXAMINATION:  GENERAL:  25 y.o.-year-old patient lying in the bed with no acute distress.  EYES: Pupils equal, round, reactive to light and accommodation. No scleral icterus. Extraocular muscles intact.  HEENT: Head atraumatic, normocephalic. Oropharynx and nasopharynx clear.  NECK:  Supple, no jugular venous distention. No thyroid enlargement, no tenderness.  LUNGS: Normal breath sounds bilaterally, no wheezing, rales,rhonchi or crepitation. No use of accessory muscles of respiration.  CARDIOVASCULAR: S1, S2 normal. No murmurs, rubs, or gallops.  ABDOMEN: Soft, non-tender, non-distended. Bowel sounds present. No organomegaly or mass. C-section scar is healing well  EXTREMITIES: No pedal edema, cyanosis, or clubbing.  NEUROLOGIC: Cranial nerves II through XII are intact. Muscle strength 5/5 in all extremities. Sensation intact.  Gait not checked.  PSYCHIATRIC: The patient is alert and oriented x 3.  SKIN: No obvious rash, lesion, or ulcer.   DATA REVIEW:   CBC  Recent Labs Lab 05/24/16 0518  WBC 7.4  HGB 9.9*  HCT 29.4*  PLT 218    Chemistries   Recent Labs Lab 05/22/16 1034  05/24/16 0518  NA 139  < > 140  K 3.4*  < > 4.0  CL 108  < > 112*  CO2 23  < > 22  GLUCOSE 108*  < > 100*  BUN 8  < > 13  CREATININE 0.62  < > 0.43*  CALCIUM 8.7*  < > 8.5*  AST 21  --   --   ALT 33  --   --   ALKPHOS 101  --   --   BILITOT 0.3  --   --   < > = values in this interval not displayed.  Cardiac Enzymes No results for input(s): TROPONINI in the last 168 hours.  Microbiology Results  Results for orders placed or performed during the hospital  encounter of 05/22/16  Blood Culture (routine x 2)     Status: None (Preliminary result)   Collection Time: 05/22/16 10:34 AM  Result Value Ref Range Status   Specimen Description BLOOD LEFT ANTECUBITAL  Final   Special Requests   Final    BOTTLES DRAWN AEROBIC AND ANAEROBIC  AER 8CC ANA 6CC   Culture NO GROWTH 2 DAYS  Final   Report Status PENDING  Incomplete  Urine culture     Status: Abnormal   Collection Time: 05/22/16 11:56 AM  Result Value Ref Range Status   Specimen Description URINE, RANDOM  Final   Special Requests NONE  Final   Culture MULTIPLE SPECIES PRESENT, SUGGEST RECOLLECTION (A)  Final   Report Status 05/24/2016 FINAL  Final  Blood Culture (routine x 2)     Status: None (Preliminary result)   Collection Time: 05/22/16 12:09 PM  Result Value Ref Range Status   Specimen Description BLOOD RIGHT ANTECUBITAL  Final   Special Requests   Final    BOTTLES DRAWN AEROBIC AND ANAEROBIC  AER 5CC ANA 7CC   Culture NO GROWTH 2 DAYS  Final   Report Status PENDING  Incomplete    RADIOLOGY:  Dg Chest 2 View  Result Date: 05/22/2016 CLINICAL DATA:  C-section 11 days prior.  Fever. EXAM: CHEST  2 VIEW COMPARISON:  None. FINDINGS: Normal mediastinum and cardiac silhouette. Normal pulmonary vasculature. No evidence of effusion, infiltrate, or pneumothorax. No acute bony abnormality. IMPRESSION: No acute cardiopulmonary process. Electronically Signed   By: Genevive Bi M.D.   On: 05/22/2016 11:39   Ct Abdomen Pelvis W Contrast  Result Date: 05/22/2016 CLINICAL DATA:  Status post C-section 11 days ago.  Fever. EXAM: CT ABDOMEN AND PELVIS WITH CONTRAST TECHNIQUE: Multidetector CT imaging of the abdomen and pelvis was performed using the standard protocol following bolus administration of intravenous contrast. CONTRAST:  ISOVUE-300 IOPAMIDOL (ISOVUE-300) INJECTION 61% COMPARISON:  None. FINDINGS: Normal lung bases. No free air. There is a small amount of free fluid in the pelvis.  There is also a small amount of fluid in the right pericolic gutter adjacent to the appendix. The appendix however is normal in caliber measuring 4 mm with no definitive periappendiceal stranding. Liver, portal vein, gallbladder, spleen, adrenal glands, and pancreas are normal. The abdominal aorta is normal in caliber. No adenopathy. The stomach and small bowel are normal.  The colon is normal in appearance. The appendix is described above. No adenopathy. The uterus is enlarged consistent with recent pregnancy. No gas seen within the uterus. No definitive evidence of infection. The ovaries are symmetric and unremarkable. The bladder is normal. Postoperative changes are seen in the lower abdominal wall from recent C-section. No other abnormalities seen in the pelvis. Delayed images demonstrate no filling defects in the upper renal collecting systems. No filling defects are seen in either ureter. The ureters are intact from the kidneys to the bladder. No acute bony abnormalities. IMPRESSION: 1. There is a small amount of fluid adjacent to the appendix. This is probably fluid in the right pericolic gutter which is tracks superiorly from the pelvis. There is no convincing evidence of acute appendicitis. If there is high concern, a short-term follow-up could be performed. 2. There is a small amount of fluid in the pelvis which is not unexpected after recent C-section. No abscess identified. Electronically Signed   By: Gerome Sam III M.D   On: 05/22/2016 15:41    EKG:   Orders placed or performed during the hospital encounter of 05/22/16  . EKG 12-Lead  . EKG 12-Lead  . EKG      Management plans discussed with the patient, family and they are in agreement.  CODE STATUS:     Code Status Orders        Start     Ordered   05/22/16 1731  Full code  Continuous     05/22/16 1730    Code Status History    Date Active Date Inactive Code Status Order ID Comments User Context   05/11/2016  6:34 PM  05/14/2016  6:18 PM Full Code [id number]  Hildred Laser, MD Inpatient      TOTAL TIME TAKING CARE OF THIS PATIENT: 45  minutes.   Note: This dictation was prepared with Dragon dictation along with smaller phrase technology. Any transcriptional errors that result from this process are unintentional.   @  on 05/24/2016 at 11:17 AM  Between 7am to 6pm - Pager - [phone]  After 6pm go to [email or web address] - password [email or web address] [facility]  [place] [facility]  Office  [phone]  CC: Primary care physician; Hildred Laser, MD

## 2016-05-24 NOTE — Progress Notes (Signed)
IV was removed. Discharge instructions, follow-up appointments, and prescriptions were provided to the pt. The pt was taken downstairs via wheelchair by Nursing.

## 2016-05-24 NOTE — Addendum Note (Signed)
Addendum  created 05/24/16 1548 by Alver FisherAmy Gaetan Spieker, MD   Cosign clinical note

## 2016-05-27 LAB — CULTURE, BLOOD (ROUTINE X 2)
CULTURE: NO GROWTH
CULTURE: NO GROWTH

## 2016-06-15 ENCOUNTER — Telehealth: Payer: Self-pay | Admitting: Obstetrics and Gynecology

## 2016-06-15 NOTE — Telephone Encounter (Signed)
Called pt she states that she has very small round place along incision line that she noticed a small amount of blood from today. Pt questions what she should do, advised pt to keep wound clean and covered with band aid until next appointment in a few days. PT gave verbal understanding.

## 2016-06-15 NOTE — Telephone Encounter (Signed)
She has a place on her incision that is still bleeding from 7/25. Its a small spot

## 2016-06-24 ENCOUNTER — Ambulatory Visit (INDEPENDENT_AMBULATORY_CARE_PROVIDER_SITE_OTHER): Payer: BC Managed Care – PPO | Admitting: Obstetrics and Gynecology

## 2016-06-24 ENCOUNTER — Encounter: Payer: Self-pay | Admitting: Obstetrics and Gynecology

## 2016-06-24 DIAGNOSIS — O9081 Anemia of the puerperium: Secondary | ICD-10-CM

## 2016-06-24 DIAGNOSIS — O99019 Anemia complicating pregnancy, unspecified trimester: Secondary | ICD-10-CM

## 2016-06-24 DIAGNOSIS — K5900 Constipation, unspecified: Secondary | ICD-10-CM

## 2016-06-24 DIAGNOSIS — Z8742 Personal history of other diseases of the female genital tract: Secondary | ICD-10-CM

## 2016-06-24 NOTE — Progress Notes (Signed)
   OBSTETRICS POSTPARTUM CLINIC PROGRESS NOTE  Subjective:     Jenny Delgado is a 25 y.o. [phone] female who presents for a postpartum visit. She is 6 weeks postpartum following a repeat low cervical transverse Cesarean section. I have fully reviewed the prenatal and intrapartum course. The delivery was at 39 gestational weeks.  Anesthesia: spinal. Postpartum course was complicated by sepsis UTl 2 weeks postpartum requiring hospital admission and IV antibiotics. Baby's course has been well. Baby is feeding by breast. Bleeding: patient has not resumed menses, with no LMP recorded. Bowel function is abnormal: constipation. Bladder function is normal. Patient is not sexually active. Contraception method desired is Mirena IUD. Postpartum depression screening: negative .  Reports left groin pain has mostly resolved since delivery.   The following portions of the patient's history were reviewed and updated as appropriate: allergies, current medications, past family history, past medical history, past social history, past surgical history and problem list.  Review of Systems A comprehensive review of systems was negative except for: Gastrointestinal: positive for constipation   Objective:    BP 104/67 (BP Location: Left Arm, Patient Position: Sitting, Cuff Size: Normal)   Pulse 91   Ht 5\' 3"  (1.6 m)   Wt 154 lb 12.8 oz (70.2 kg)   Breastfeeding? Yes   BMI 27.42 kg/m   General:  alert and no distress   Breasts:  inspection negative, no nipple discharge or bleeding, no masses or nodularity palpable  Lungs: clear to auscultation bilaterally  Heart:  regular rate and rhythm, S1, S2 normal, no murmur, click, rub or gallop  Abdomen: soft, non-tender; bowel sounds normal; no masses,  no organomegaly.  Well healed Pfannenstiel incision   Vulva:  normal  Vagina: normal vagina, no discharge, exudate, lesion, or erythema  Cervix:  no cervical motion tenderness and no lesions  Corpus: normal size,  contour, position, consistency, mobility, non-tender  Adnexa:  normal adnexa and no mass, fullness, tenderness  Rectal Exam: Not performed.         Labs:  Lab Results  Component Value Date   HGB 9.9 (L) 05/24/2016    Assessment:   Routine postpartum exam.   S/p cesarean section Postpartum anemia, mild Constipation H/o abnormal pap smear  Plan:   1. Contraception: Mirena IUD 2. Will check Hgb for h/o anemia. Can d/c iron tablets due to constipation.  Continue with Colace.  3. Pap smear done today for h/o prior abnormal pap smear. 4. Follow up in: 2-3 weeks for IUD placement, 3-6 months for annual exam.     Jenny LaserAnika Keena Heesch, MD [facility]

## 2016-06-25 LAB — HEMOGLOBIN AND HEMATOCRIT, BLOOD
HEMOGLOBIN: 12.5 g/dL (ref 11.1–15.9)
Hematocrit: 37.9 % (ref 34.0–46.6)

## 2016-06-29 LAB — PAP IG W/ RFLX HPV ASCU: PAP Smear Comment: 0

## 2016-07-15 ENCOUNTER — Ambulatory Visit: Payer: BC Managed Care – PPO | Admitting: Obstetrics and Gynecology

## 2016-07-15 ENCOUNTER — Ambulatory Visit (INDEPENDENT_AMBULATORY_CARE_PROVIDER_SITE_OTHER): Payer: BC Managed Care – PPO | Admitting: Obstetrics and Gynecology

## 2016-07-15 ENCOUNTER — Encounter: Payer: Self-pay | Admitting: Obstetrics and Gynecology

## 2016-07-15 VITALS — BP 112/68 | HR 89 | Ht 63.0 in | Wt 152.8 lb

## 2016-07-15 DIAGNOSIS — Z30430 Encounter for insertion of intrauterine contraceptive device: Secondary | ICD-10-CM | POA: Diagnosis not present

## 2016-07-15 DIAGNOSIS — N9411 Superficial (introital) dyspareunia: Secondary | ICD-10-CM

## 2016-07-15 NOTE — Patient Instructions (Addendum)

## 2016-07-15 NOTE — Progress Notes (Signed)
    GYNECOLOGY CLINIC PROCEDURE NOTE  Jenny Delgado is a 25 y.o. [phone] here for Mirena IUD insertion. No GYN concerns.  Last pap smear was on 06/24/2016 and was normal.  No LMP recorded. Patient is not currently having periods (Reason: Lactating).   ROS: Patient reports complaints of mild dyspareunia since her delivery.  Thinks it may be due to decreased lubrication.    IUD Insertion Procedure Note Patient identified, informed consent performed, consent signed.   Discussed risks of irregular bleeding, cramping, infection, malpositioning or misplacement of the IUD outside the uterus which may require further procedure such as laparoscopy. Time out was performed.  Urine pregnancy test negative.  Speculum placed in the vagina.  Cervix visualized.  Cleaned with Betadine x 2.  Grasped anteriorly with a single tooth tenaculum.  Uterus sounded to 8 cm.  Mirena IUD placed per manufacturer's recommendations.  Strings trimmed to 3 cm. Tenaculum was removed, good hemostasis noted.  Patient tolerated procedure well.   Patient was given post-procedure instructions.  She was advised to have backup contraception for one week.  Patient was also asked to check IUD strings periodically and follow up in 4 weeks for IUD check.  Advised on lubricants as needed for dyspareunia   Hildred LaserAnika Kegan Mckeithan, MD [facility]

## 2016-07-19 ENCOUNTER — Telehealth: Payer: Self-pay | Admitting: Obstetrics and Gynecology

## 2016-07-19 NOTE — Telephone Encounter (Signed)
Letter faxed.

## 2016-07-19 NOTE — Telephone Encounter (Signed)
Patient called stating she needs to get her flu shot for work but she is breastfeeding so they want a note from us saying it is ok. She works at [employer] fax # [phone]. Thanks

## 2016-07-20 DIAGNOSIS — Z0289 Encounter for other administrative examinations: Secondary | ICD-10-CM

## 2016-08-10 ENCOUNTER — Ambulatory Visit (INDEPENDENT_AMBULATORY_CARE_PROVIDER_SITE_OTHER): Payer: BC Managed Care – PPO | Admitting: Obstetrics and Gynecology

## 2016-08-10 VITALS — BP 110/68 | HR 73 | Ht 63.0 in | Wt 150.6 lb

## 2016-08-10 DIAGNOSIS — N941 Unspecified dyspareunia: Secondary | ICD-10-CM

## 2016-08-10 DIAGNOSIS — Z30431 Encounter for routine checking of intrauterine contraceptive device: Secondary | ICD-10-CM | POA: Diagnosis not present

## 2016-08-10 MED ORDER — LIDOCAINE HCL 2 % EX GEL: 1.0000 "application " | CUTANEOUS | 2 refills | Status: DC | PRN

## 2016-08-10 NOTE — Progress Notes (Signed)
     GYNECOLOGY CLINIC PROGRESS NOTE  History:  25 y.o. [id number] here today for today for IUD string check; Mirena IUD was placed 4 weeks ago 07/15/16).Notes spotting with the IUD, but otherwise no major complaints.   The following portions of the patient's history were reviewed and updated as appropriate: allergies, current medications, past family history, past medical history, past social history, past surgical history and problem list. Last pap smear on 06/24/2016 was normal, negative HRHPV.  Review of Systems:   Genitourinary: notes painful intercourse upon penetration.  Tried lubricants which have not helped much.  Notes that it is an "achy" sensation".  Denies vaginal discharge or irritation.   Objective:  Physical Exam Blood pressure 110/68, pulse 73, height 5\' 3"  (1.6 m), weight 150 lb 9.6 oz (68.3 kg), currently breastfeeding. CONSTITUTIONAL: Well-developed, well-nourished female in no acute distress.  ABDOMEN: Soft, no distention noted.   PELVIC: Normal appearing external genitalia; normal appearing vaginal mucosa and cervix. Scant dark brown blood in vaginal vault.  IUD strings visualized, about 2.5 cm in length outside cervix.  EXTREMITIES:  Non-tender, no edema or cyanosis NEUROLOGIC: Grossly normal.    Assessment & Plan:  Normal IUD check. Patient to keep IUD in place for five years; can come in for removal if she desires pregnancy within the next five years. Routine preventative health maintenance measures emphasized. Discussed use of numbing gel at introitus to manage pain symptoms.  If still no relief, can consider referral to pelvic floor physical therapy.    Hildred LaserAnika Nayla Dias, MD [facility]

## 2016-11-19 NOTE — Progress Notes (Deleted)
GYNECOLOGY ANNUAL PHYSICAL EXAM PROGRESS NOTE  Subjective:    Jenny Delgado is a 26 y.o. [phone] female who presents for an annual exam. The patient has no complaints today. The patient is sexually active. GYN screening history: last pap: was normal. The patient wears seatbelts: yes. The patient participates in regular exercise: no. Has the patient ever been transfused or tattooed?: no. The patient reports that there is not domestic violence in her life.    Gynecologic History No LMP recorded. Patient is not currently having periods (Reason: Lactating). Menstrual History: OB History    Gravida Para Term Preterm AB Living   6 2 2   4 2    SAB TAB Ectopic Multiple Live Births   3     0 2      Menarche age: *** No LMP recorded. Patient is not currently having periods (Reason: Lactating).    Contraception: IUD History of STI's:  Last Pap: 06/2016. Results were: normal. Denies h/o abnormal pap smears. Last mammogram: ***. Results were: {norm/abn:16337}   Obstetric History   G6   P2   T2   P0   A4   L2    SAB3   TAB0   Ectopic0   Multiple0   Live Births2     # Outcome Date GA Lbr Len/2nd Weight Sex Delivery Anes PTL Lv  6 Term 05/11/16 2345w0d  8 lb 9.9 oz (3.91 kg) F CS-LTranv Spinal  LIV     Jenny Delgado     Apgar1:  8                Apgar5: 9  5 SAB 05/2015        FD  4 Term 09/20/07 5745w0d  7 lb 8 oz (3.402 kg) M CS-LTranv EPI  LIV     Name: Jenny Delgado Donathathan  3 AB           2 SAB           1 SAB               Past Medical History:  Diagnosis Date  . Anxiety   . Depression   . Headache(784.0)   . IUD contraception    mirena    Past Surgical History:  Procedure Laterality Date  . CESAREAN SECTION  2008  . CESAREAN SECTION N/A 05/11/2016   Procedure: REPEAT CESAREAN SECTION;  Surgeon: Hildred LaserAnika Cherry, MD;  Location: [facility] ORS;  Service: Obstetrics;  Laterality: N/A;  . WISDOM TOOTH EXTRACTION      Family History  Problem Relation Age of Onset  .  Hypertension Father   . Diabetes Maternal Grandfather   . Breast cancer Neg Hx   . Cervical cancer Neg Hx   . Colon cancer Neg Hx   . Ovarian cancer Neg Hx   . Heart disease Neg Hx     Social History   Social History  . Marital status: Married    Spouse name: N/A  . Number of children: N/A  . Years of education: N/A   Occupational History  . Not on file.   Social History Main Topics  . Smoking status: Never Smoker  . Smokeless tobacco: Never Used  . Alcohol use Yes     Comment: ocassional wine coolers   . Drug use: No  . Sexual activity: Not Currently    Birth control/ protection: IUD     Comment: Mirena 07/15/16   Other Topics Concern  . Not on file  Social History Narrative  . No narrative on file    Current Outpatient Prescriptions on File Prior to Visit  Medication Sig Dispense Refill  . ferrous sulfate 325 (65 FE) MG tablet Take 1 tablet (325 mg total) by mouth 2 (two) times daily with a meal. 60 tablet 2  . ibuprofen (ADVIL,MOTRIN) 600 MG tablet Take 1 tablet (600 mg total) by mouth every 6 (six) hours. 30 tablet 0  . levonorgestrel (MIRENA) 20 MCG/24HR IUD 1 each by Intrauterine route once.    . lidocaine (XYLOCAINE) 2 % jelly Apply 1 application topically as needed. 30 mL 2  . [DISCONTINUED] misoprostol (CYTOTEC) 200 MCG tablet Take 4 tablets (800 mcg total) by mouth once. (Patient not taking: Reported on 06/03/2015) 4 tablet 1   No current facility-administered medications on file prior to visit.     Allergies  Allergen Reactions  . Bactrim [Sulfamethoxazole-Trimethoprim] Hives, Rash and Other (See Comments)    Patient states she had a fever.  Jenny Delgado [Escitalopram Oxalate] Other (See Comments)    Facial numbness  . Jenny Delgado [Sertraline Hcl] Other (See Comments)    Facial numbness      Review of Systems Constitutional: negative for chills, fatigue, fevers and sweats Eyes: negative for irritation, redness and visual disturbance Ears, nose, mouth,  throat, and face: negative for hearing loss, nasal congestion, snoring and tinnitus Respiratory: negative for asthma, cough, sputum Cardiovascular: negative for chest pain, dyspnea, exertional chest pressure/discomfort, irregular heart beat, palpitations and syncope Gastrointestinal: negative for abdominal pain, change in bowel habits, nausea and vomiting Genitourinary: negative for abnormal menstrual periods, genital lesions, sexual problems and vaginal discharge, dysuria and urinary incontinence Integument/breast: negative for breast lump, breast tenderness and nipple discharge Hematologic/lymphatic: negative for bleeding and easy bruising Musculoskeletal:negative for back pain and muscle weakness Neurological: negative for dizziness, headaches, vertigo and weakness Endocrine: negative for diabetic symptoms including polydipsia, polyuria and skin dryness Allergic/Immunologic: negative for hay fever and urticaria        Objective:  currently breastfeeding. There is no height or weight on file to calculate BMI.     General Appearance:    Alert, cooperative, no distress, appears stated age  Head:    Normocephalic, without obvious abnormality, atraumatic  Eyes:    PERRL, conjunctiva/corneas clear, EOM's intact, both eyes  Ears:    Normal external ear canals, both ears  Nose:   Nares normal, septum midline, mucosa normal, no drainage or sinus tenderness  Throat:   Lips, mucosa, and tongue normal; teeth and gums normal  Neck:   Supple, symmetrical, trachea midline, no adenopathy; thyroid: no enlargement/tenderness/nodules; no carotid bruit or JVD  Back:     Symmetric, no curvature, ROM normal, no CVA tenderness  Lungs:     Clear to auscultation bilaterally, respirations unlabored  Chest Wall:    No tenderness or deformity   Heart:    Regular rate and rhythm, S1 and S2 normal, no murmur, rub or gallop  Breast Exam:    No tenderness, masses, or nipple abnormality  Abdomen:     Soft,  non-tender, bowel sounds active all four quadrants, no masses, no organomegaly.    Genitalia:    Pelvic:external genitalia normal, vagina without lesions, discharge, or tenderness, rectovaginal septum  normal. Cervix normal in appearance, no cervical motion tenderness, no adnexal masses or tenderness.  Uterus normal size, shape, mobile, regular contours, nontender.  Rectal:    Normal external sphincter.  No hemorrhoids appreciated. Internal exam not done.   Extremities:  Extremities normal, atraumatic, no cyanosis or edema  Pulses:   2+ and symmetric all extremities  Skin:   Skin color, texture, turgor normal, no rashes or lesions  Lymph nodes:   Cervical, supraclavicular, and axillary nodes normal  Neurologic:   CNII-XII intact, normal strength, sensation and reflexes throughout   .  Labs:  Lab Results  Component Value Date   WBC 7.4 05/24/2016   HGB 9.9 (L) 05/24/2016   HCT 37.9 06/24/2016   MCV 78.0 (L) 05/24/2016   PLT 218 05/24/2016    Lab Results  Component Value Date   CREATININE 0.43 (L) 05/24/2016   BUN 13 05/24/2016   NA 140 05/24/2016   K 4.0 05/24/2016   CL 112 (H) 05/24/2016   CO2 22 05/24/2016    Lab Results  Component Value Date   ALT 33 05/22/2016   AST 21 05/22/2016   ALKPHOS 101 05/22/2016   BILITOT 0.3 05/22/2016    No results found for: TSH   Assessment:    Healthy female exam.    Plan:     {gyn plan:13146}     Debbe Bales, CMA [facility]

## 2016-11-23 ENCOUNTER — Encounter: Payer: BC Managed Care – PPO | Admitting: Obstetrics and Gynecology

## 2016-11-24 ENCOUNTER — Encounter: Payer: Self-pay | Admitting: Obstetrics and Gynecology

## 2016-11-24 ENCOUNTER — Ambulatory Visit (INDEPENDENT_AMBULATORY_CARE_PROVIDER_SITE_OTHER): Payer: BC Managed Care – PPO | Admitting: Obstetrics and Gynecology

## 2016-11-24 VITALS — BP 109/68 | HR 105 | Ht 63.0 in | Wt 150.0 lb

## 2016-11-24 DIAGNOSIS — N6310 Unspecified lump in the right breast, unspecified quadrant: Secondary | ICD-10-CM | POA: Diagnosis not present

## 2016-11-24 DIAGNOSIS — N9411 Superficial (introital) dyspareunia: Secondary | ICD-10-CM

## 2016-11-24 MED ORDER — CLONAZEPAM 1 MG PO TABS: 1.0000 mg | ORAL_TABLET | Freq: Two times a day (BID) | ORAL | 4 refills | Status: DC | PRN

## 2016-11-24 NOTE — Progress Notes (Signed)
Pt is here with c/o golf ball size lump in right breast that is not painful. Denies nipple discharge.

## 2016-11-24 NOTE — Progress Notes (Signed)
   GYNECOLOGY CLINIC VISIT APPOINTMENT  Subjective:     Jenny Delgado is an 26 y.o. female who presents for evaluation of a breast mass.  Notes that this mass has been present since prior to pregnancy over 1 year ago, however was small and not bothersome. Patient notes that she is not sure when the mass got bigger as her breasts were enlarged during her pregnancy and postpartum period when she was breastfeeding.  Recently stopped breastfeeding 2-3 months ago and noticed that the mass had gotten larger, approximately golf ball sized.  Patient does not routinely do self breast exams. The mass does not change during menstrual cycle. The mass is not tender. Patient denies nipple discharge. Breast cancer risk factors include: none.  The following portions of the patient's history were reviewed and updated as appropriate: allergies, current medications, past family history, past medical history, past social history, past surgical history and problem list.  Review of Systems A comprehensive review of systems was negative except for: Genitourinary: positive for vaginal discomfort at introitus during intercourse. Was unable to use lidcoaine gel as it was unavailable at her pharmacy Integument/breast: positive for breast lump     Objective:    BP 109/68   Pulse (!) 105   Ht 5\' 3"  (1.6 m)   Wt 150 lb (68 kg)   BMI 26.57 kg/m  General appearance: alert and no distress Neck: no adenopathy, no carotid bruit, no JVD, supple, symmetrical, trachea midline and thyroid not enlarged, symmetric, no tenderness/mass/nodules Lungs: clear to auscultation bilaterally Breasts: No nipple retraction or dimpling, No nipple discharge or bleeding, No axillary or supraclavicular adenopathy, positive findings: smooth, irregular, firm, mobile and non-tender nodule located on the right beneath the nipple in center of breast,  6 x 4 cm.  Heart: regular rate and rhythm, S1, S2 normal, no murmur, click, rub or gallop     Pelvis: deferred.      Assessment:    Breast mass, likely benign (breast cyst vs fibrodadenoma)  Dyspareunia (superficial)  Plan:   - Palpable breast mass, likely benign.  No personal or family h/o breast or ovarian cancer. Will order ultrasound and possible mammogram if needed.  Discussed possibility of needing drainage or excision for mass.  Patient notes understanding.  - Will change from Lidocaine gel prescription to a different medication. If this doesn't help, patient may benefit from pelvic floor physical therapy.   Discussed fibrocystic disease and alleviating measures. Reassured the patient that the finding is most likely benign. Discussed need for futher evaluation. Arranged for mammogram. Arranged for ultrasound.    To f/u if symptoms worsen after evaluation.    Hildred LaserAnika Kveon Casanas, MD [facility]

## 2016-11-24 NOTE — Patient Instructions (Addendum)
Breast Cyst A breast cyst is a sac in the breast that is filled with fluid. Breast cysts are usually noncancerous (benign). They are common among women, and they are most often located in the upper, outer portion of the breast. One or more cysts may develop. They form when fluid builds up inside of the breast glands. There are several types of breast cysts:  Macrocyst. This is a cyst that is about 2 inches (5.1 cm) across (in diameter).  Microcyst. This is a very small cyst that you cannot feel, but it can be seen with imaging tests such as an X-ray of the breast (mammogram) or ultrasound.  Galactocele. This is a cyst that contains milk. It may develop if you suddenly stop breastfeeding. Breast cysts do not increase your risk of breast cancer. They usually disappear after menopause, unless you take artificial hormones (are on hormone therapy). What are the causes? The exact cause of breast cysts is not known. Possible causes include:  Blockage of tubes (ducts) in the breast glands, which leads to fluid buildup. Duct blockage may result from:  Fibrocystic breast changes. This is a common, benign condition that occurs when women go through hormonal changes during the menstrual cycle. This is a common cause of multiple breast cysts.  Overgrowth of breast tissue or breast glands.  Scar tissue in the breast from previous surgery.  Changes in certain female hormones (estrogen and progesterone). What increases the risk? You may be more likely to develop breast cysts if you have not gone through menopause. What are the signs or symptoms? Symptoms of a breast cyst may include:  Feeling one or more smooth, round, soft lumps (like grapes) in the breast that are easily moveable. The lump(s) may get bigger and more painful before your period and get smaller after your period.  Breast discomfort or pain. How is this diagnosed? A cyst can be felt during a physical exam by your health care provider. A  mammogram and ultrasound will be done to confirm the diagnosis. Fluid may be removed from the cyst with a needle (fine-needle aspiration) and tested to make sure the cyst is not cancerous. How is this treated? Treatment may not be necessary. Your health care provider may monitor the cyst to see if it goes away on its own. If the cyst is uncomfortable or gets bigger, or if you do not like how the cyst makes your breast look, you may need treatment. Treatment may include:  Hormone treatment.  Fine-needle aspiration, to drain fluid from the cyst. There is a chance of the cyst coming back (recurring) after aspiration.  Surgery to remove the cyst. Follow these instructions at home:  See your health care provider regularly.  Get a yearly physical exam.  If you are 5620-28 years old, get a clinical breast exam every 1-3 years. After age 28, get this exam every year.  Get mammograms as often as directed.  Do a breast self-exam every month, or as often as directed. Having many breast cysts, or "lumpy" breasts, may make it harder to feel for new lumps. Understand how your breasts normally look and feel, and write down any changes in your breasts so you can tell your health care provider about the changes. A breast self-exam involves:  Comparing your breasts in the mirror.  Looking for visible changes in your skin or nipples.  Feeling for lumps or changes.  Take over-the-counter and prescription medicines only as told by your health care provider.  Wear a  supportive bra, especially when exercising.  Follow instructions from your health care provider about eating and drinking restrictions.  Avoid caffeine.  Cut down on salt (sodium) in what you eat and drink, especially before your menstrual period. Too much sodium can cause fluid buildup (retention), breast swelling, and discomfort.  Keep all follow-up visits as told your health care provider. This is important. Contact a health care  provider if:  You feel, or think you feel, a lump in your breast.  You notice that both breasts look or feel different than usual.  Your breast is still causing pain after your menstrual period is over.  You find new lumps or bumps that were not there before.  You feel lumps in your armpit (axilla). Get help right away if:  You have severe pain, tenderness, redness, or warmth in your breast.  You have fluid or blood leaking from your nipple.  Your breast lump becomes hard and painful.  You notice dimpling or wrinkling of the breast or nipple. This information is not intended to replace advice given to you by your health care provider. Make sure you discuss any questions you have with your health care provider. Document Released: 10/04/2005 Document Revised: 06/25/2016 Document Reviewed: 06/25/2016 Elsevier Interactive Patient Education  2017 [employer]      Fibroadenoma Introduction Fibroadenoma is a type of breast tumor that is not cancerous (is benign). These tumors are made up of breast tissue and the tissue that holds breast tissue together (connective tissue). There are several types of fibroadenomas:  Simple fibroadenoma. This is the most common type. It consists of a single type of tissue throughout the tumor.  Complex fibroadenoma. This type of tumor contains more than one kind of tissue or irregular tissue.  Juvenile fibroadenoma. This is a type of tumor that can develop in adolescent girls. It tends to grow larger over time than other adenomas. A fibroadenoma usually occurs as a single lump, but sometimes there may be more than one lump. Fibroadenomas vary in size. They can occur in one breast or in both breasts. Some fibroadenomas are too small to feel, but a larger one may feel like a firm, smooth lump that moves beneath your fingers. Although fibroadenomas are not cancer, having a fibroadenoma may slightly increase your risk for developing breast cancer in the  future. What are the causes? The exact cause of fibroadenoma is not known. What increases the risk? This condition is more likely to develop in:  Women who are 33-7 years of age.  Women of African-American descent. What are the signs or symptoms? A fibroadenoma may not cause any symptoms. These tumors usually do not cause pain unless they grow to a large size. A fibroadenoma may feel like a lump in your breast that is:  Firm.  Round.  Smooth.  Slightly moveable. How is this diagnosed? You may notice a breast lump during a breast self-exam. Your health care provider may discover it during a routine breast exam or mammogram. Your health care provider may suspect fibroadenoma if you have a breast lump that feels firm, round, and smooth and appears smooth on your mammogram. Other tests may be done to confirm the diagnosis, including:  An ultrasound to check for fluid inside the lump (cystic tumor).  A procedure that uses a needle to remove fluid from a cystic tumor. The fluid is then checked under a microscope for cancer cells.  A mammogram to examine a lump that is not cystic (is solid).  A procedure that uses a needle to remove a sample of tissue from the lump (breast biopsy) to examine under a microscope. This test is the only method that can be used to confirm that a tumor is a fibroadenoma and is not cancer. How is this treated? Treatment for this condition may include:  Having breast exams regularly to check for changes in your fibroadenoma.  Having the fibroadenoma removed. A fibroadenoma may be removed if it is:  Large.  Continuing to grow.  Causing symptoms.  Changing the appearance of your breast.  A juvenile fibroadenoma. These tend to grow large over time. Follow these instructions at home:   If you had a fibroadenoma removed, follow instructions from your health care provider for care after the procedure.  Perform breast self-exams at home as told by your  health care provider.  Keep all follow-up visits as told by your health care provider. This is important. Contact a health care provider if:  Your fibroadenoma becomes larger, feels different, or becomes painful.  You find a new breast lump.  You have any changes in the skin that covers your breast. These include:  Dimpling.  Bruising.  Thickening.  Redness.  You have any changes in your nipple.  You have fluid leaking from your nipple. This information is not intended to replace advice given to you by your health care provider. Make sure you discuss any questions you have with your health care provider. Document Released: 02/18/2015 Document Revised: 03/11/2016 Document Reviewed: 09/25/2014  2017 Elsevier

## 2016-12-07 ENCOUNTER — Ambulatory Visit
Admission: RE | Admit: 2016-12-07 | Discharge: 2016-12-07 | Disposition: A | Discharge status: Home / Self Care | Payer: BC Managed Care – PPO | Source: Ambulatory Visit | Attending: Obstetrics and Gynecology | Admitting: Obstetrics and Gynecology

## 2016-12-07 ENCOUNTER — Other Ambulatory Visit: Payer: Self-pay | Admitting: Obstetrics and Gynecology

## 2016-12-07 DIAGNOSIS — N6310 Unspecified lump in the right breast, unspecified quadrant: Secondary | ICD-10-CM | POA: Diagnosis not present

## 2016-12-07 DIAGNOSIS — R928 Other abnormal and inconclusive findings on diagnostic imaging of breast: Secondary | ICD-10-CM

## 2016-12-15 ENCOUNTER — Ambulatory Visit
Admission: RE | Admit: 2016-12-15 | Discharge: 2016-12-15 | Disposition: A | Discharge status: Home / Self Care | Payer: BC Managed Care – PPO | Source: Ambulatory Visit | Attending: Obstetrics and Gynecology | Admitting: Obstetrics and Gynecology

## 2016-12-15 DIAGNOSIS — N6310 Unspecified lump in the right breast, unspecified quadrant: Secondary | ICD-10-CM | POA: Diagnosis present

## 2016-12-15 DIAGNOSIS — R928 Other abnormal and inconclusive findings on diagnostic imaging of breast: Secondary | ICD-10-CM | POA: Diagnosis present

## 2016-12-15 DIAGNOSIS — N6031 Fibrosclerosis of right breast: Secondary | ICD-10-CM | POA: Diagnosis not present

## 2016-12-16 LAB — SURGICAL PATHOLOGY

## 2016-12-20 LAB — AEROBIC/ANAEROBIC CULTURE (SURGICAL/DEEP WOUND): CULTURE: NO GROWTH

## 2016-12-20 LAB — AEROBIC/ANAEROBIC CULTURE W GRAM STAIN (SURGICAL/DEEP WOUND)

## 2017-01-07 ENCOUNTER — Other Ambulatory Visit: Payer: Self-pay | Admitting: General Surgery

## 2017-01-07 DIAGNOSIS — O9279 Other disorders of lactation: Secondary | ICD-10-CM

## 2017-02-11 ENCOUNTER — Other Ambulatory Visit: Payer: Self-pay | Admitting: General Surgery

## 2017-02-11 ENCOUNTER — Ambulatory Visit
Admission: RE | Admit: 2017-02-11 | Discharge: 2017-02-11 | Disposition: A | Discharge status: Home / Self Care | Payer: BC Managed Care – PPO | Source: Ambulatory Visit | Attending: General Surgery | Admitting: General Surgery

## 2017-02-11 DIAGNOSIS — O9279 Other disorders of lactation: Secondary | ICD-10-CM

## 2017-03-28 ENCOUNTER — Other Ambulatory Visit: Payer: Self-pay | Admitting: General Surgery

## 2017-04-13 IMAGING — US US OB TRANSVAGINAL
1 series · 14 of 28 positions shown · non-contrast
Comparison: None.

CLINICAL DATA: Pregnant patient with vaginal bleeding. Quantitative
HCG [DATE].

EXAM:
OBSTETRIC <14 WK US AND TRANSVAGINAL OB US
TECHNIQUE: Both transabdominal and transvaginal ultrasound examinations were
performed for complete evaluation of the gestation as well as the
maternal uterus, adnexal regions, and pelvic cul-de-sac.
Transvaginal technique was performed to assess early pregnancy.

[Series 1: us ob transvaginal · 0.15mm/px · 14 of 96 slices shown]
[im 4/96]
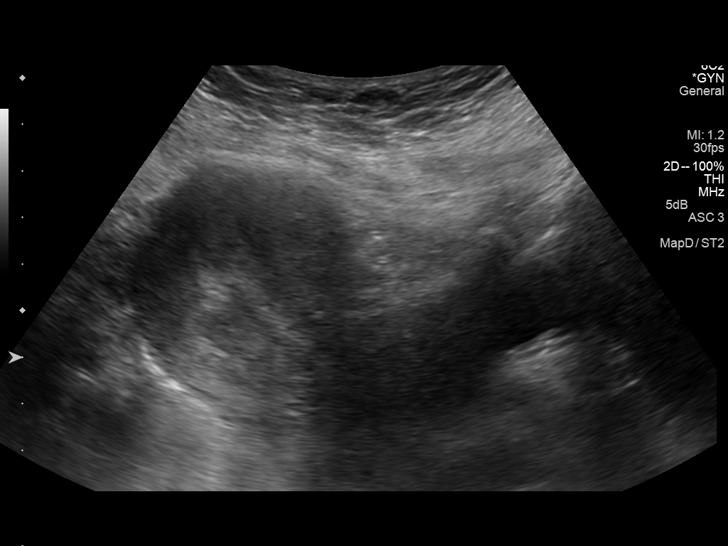
[im 11/96]
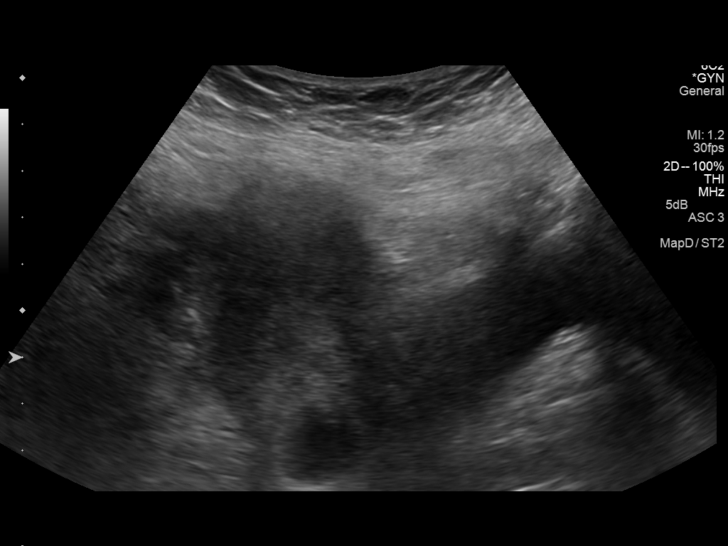
[im 18/96]
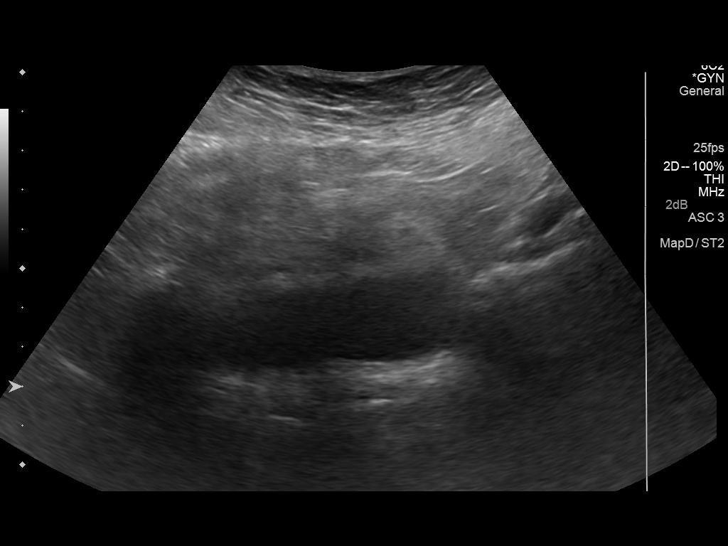
[im 25/96]
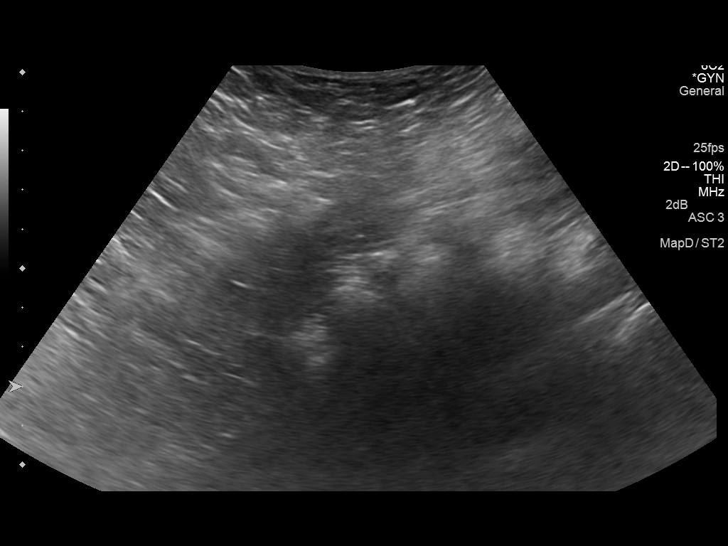
[im 32/96]
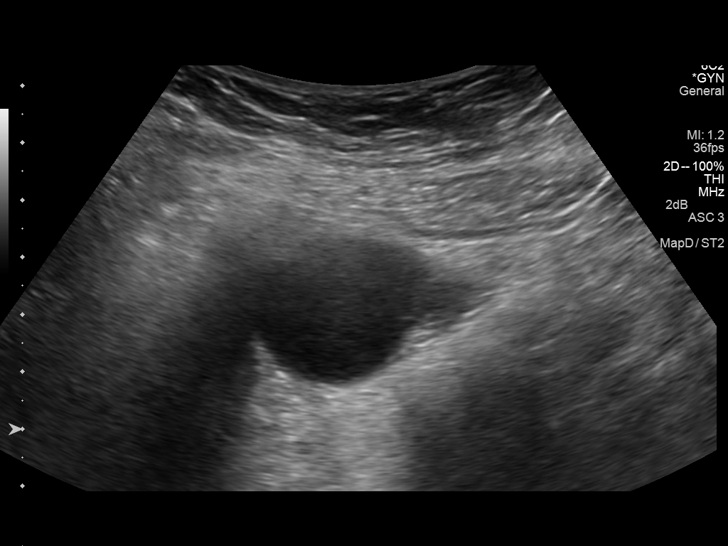
[im 39/96]
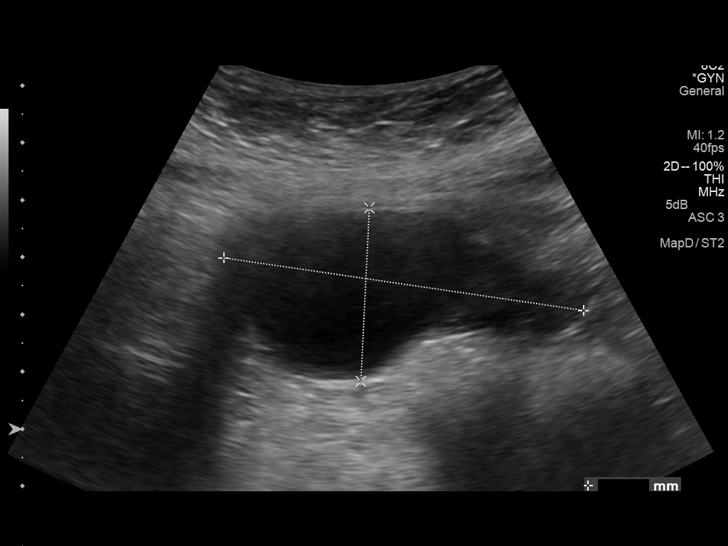
[im 46/96]
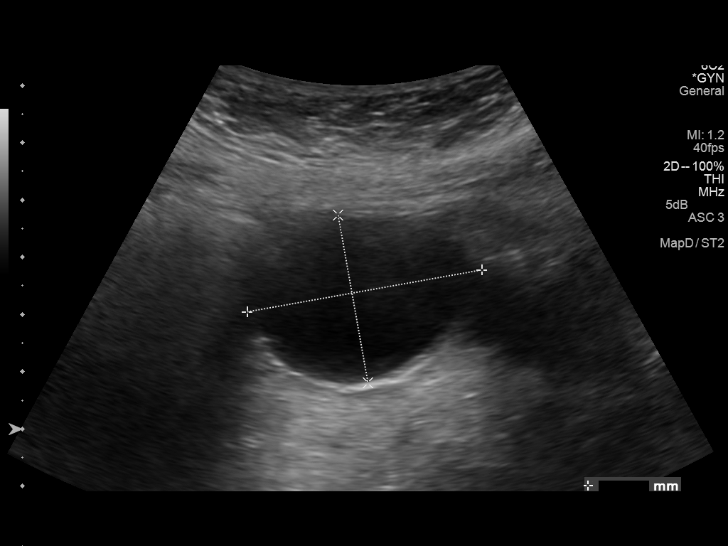
[im 53/96]
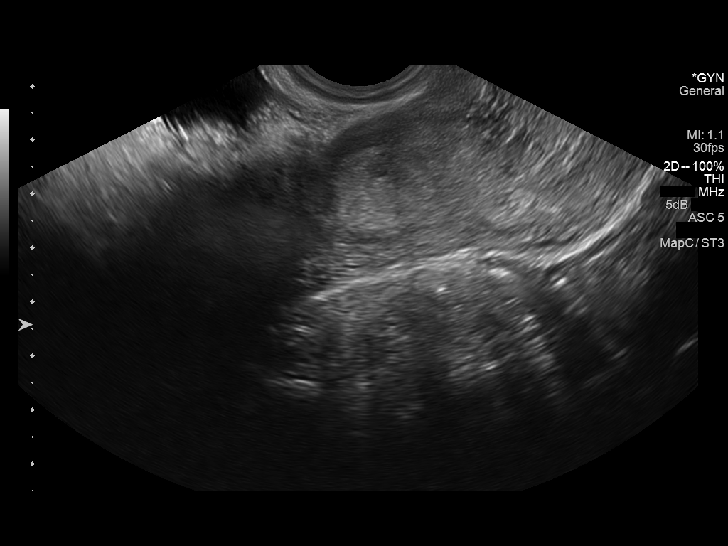
[im 60/96]
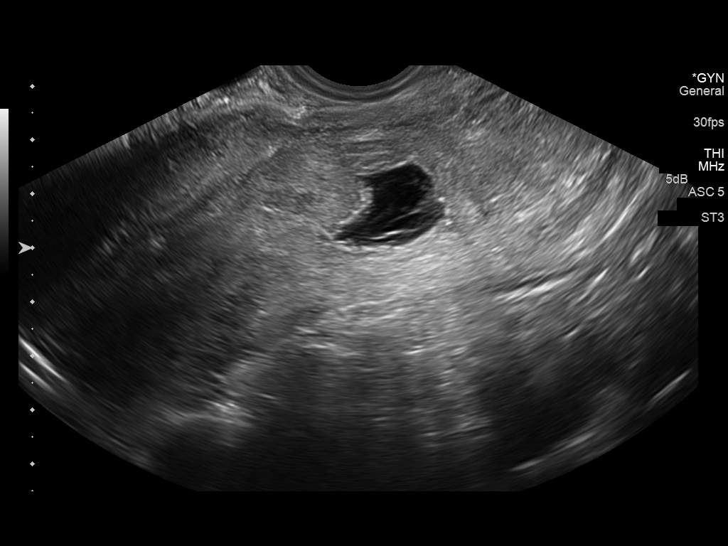
[im 67/96]
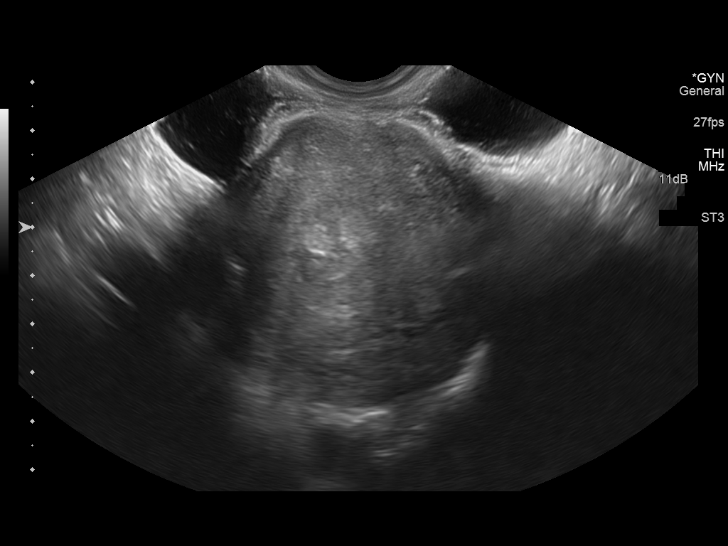
[im 74/96]
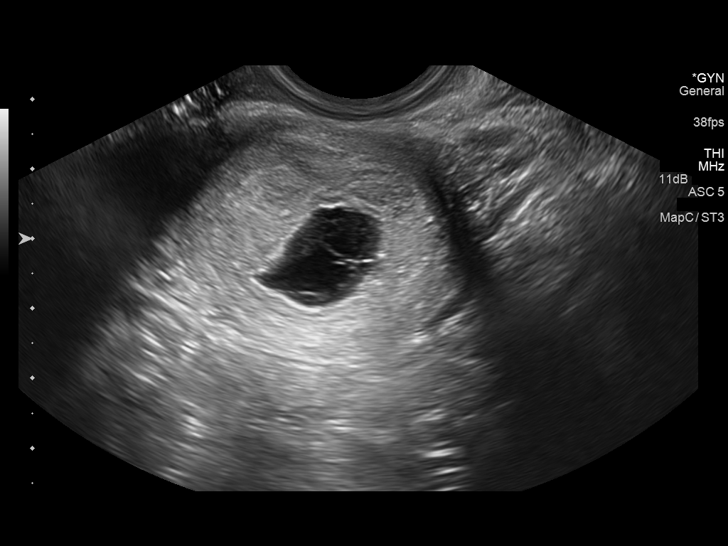
[im 81/96]
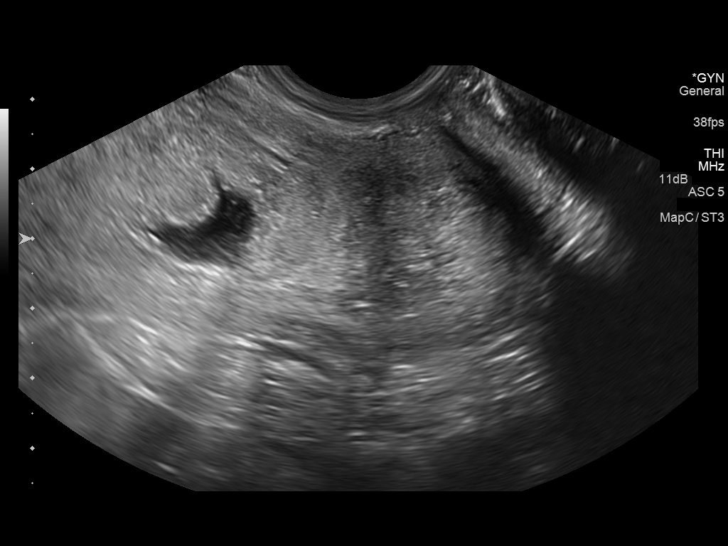
[im 88/96]
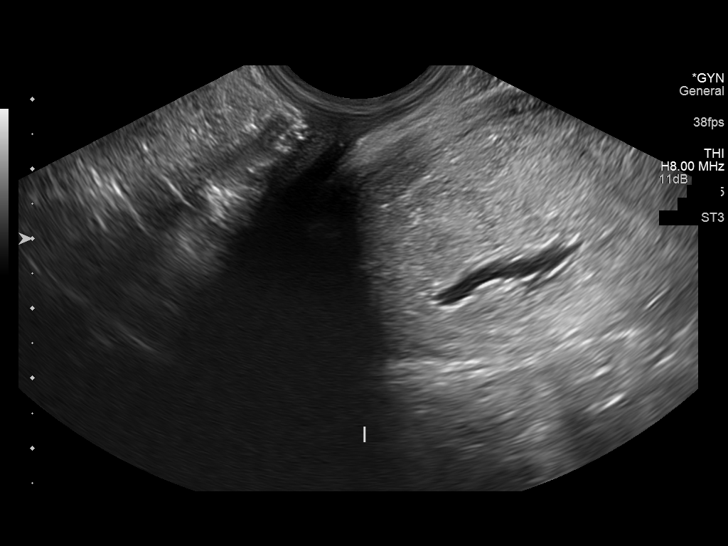
[im 96/96]
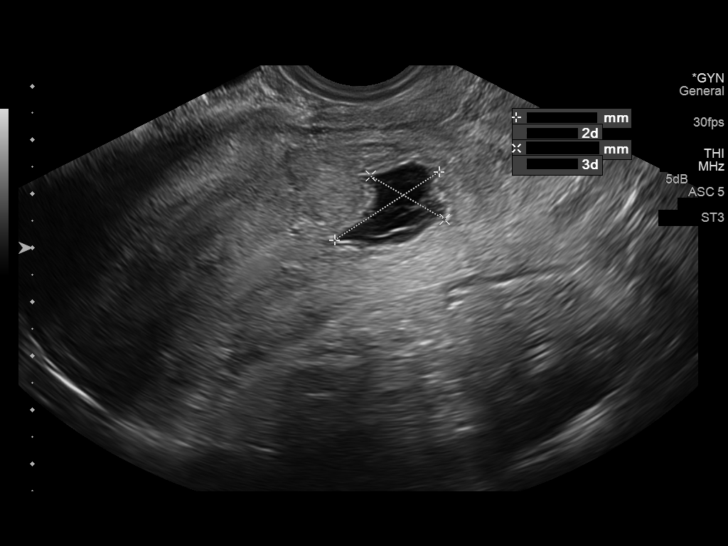

[14 of 28 positions shown; findings below may reference images not displayed]

FINDINGS: Intrauterine gestational sac: A gestational sac versus fluid is
identified in the lower uterine segment.

Yolk sac:  Not visualized.

Embryo:  Not visualized.

Cardiac Activity: Not applicable.

MSD: 18.8  mm   6 w   6  d

Maternal uterus/adnexae: The left ovary measures 6.4 x 3.0 x 4.9 cm
with a simple cyst in the ovary measuring 4.2 x 3.0 x 3.0 cm. The
right ovary is not visualized.
IMPRESSION: Findings most consistent with abortion in progress.

Simple left ovarian cyst.  The right ovary is not visualized.

## 2018-04-30 IMAGING — MG MM BREAST LOCALIZATION CLIP
2 series · 2 of 2 positions shown · non-contrast
Comparison: Previous exam(s).

CLINICAL DATA: Status post ultrasound-guided right breast biopsy

EXAM:
DIAGNOSTIC RIGHT MAMMOGRAM POST ULTRASOUND BIOPSY

[R CC]
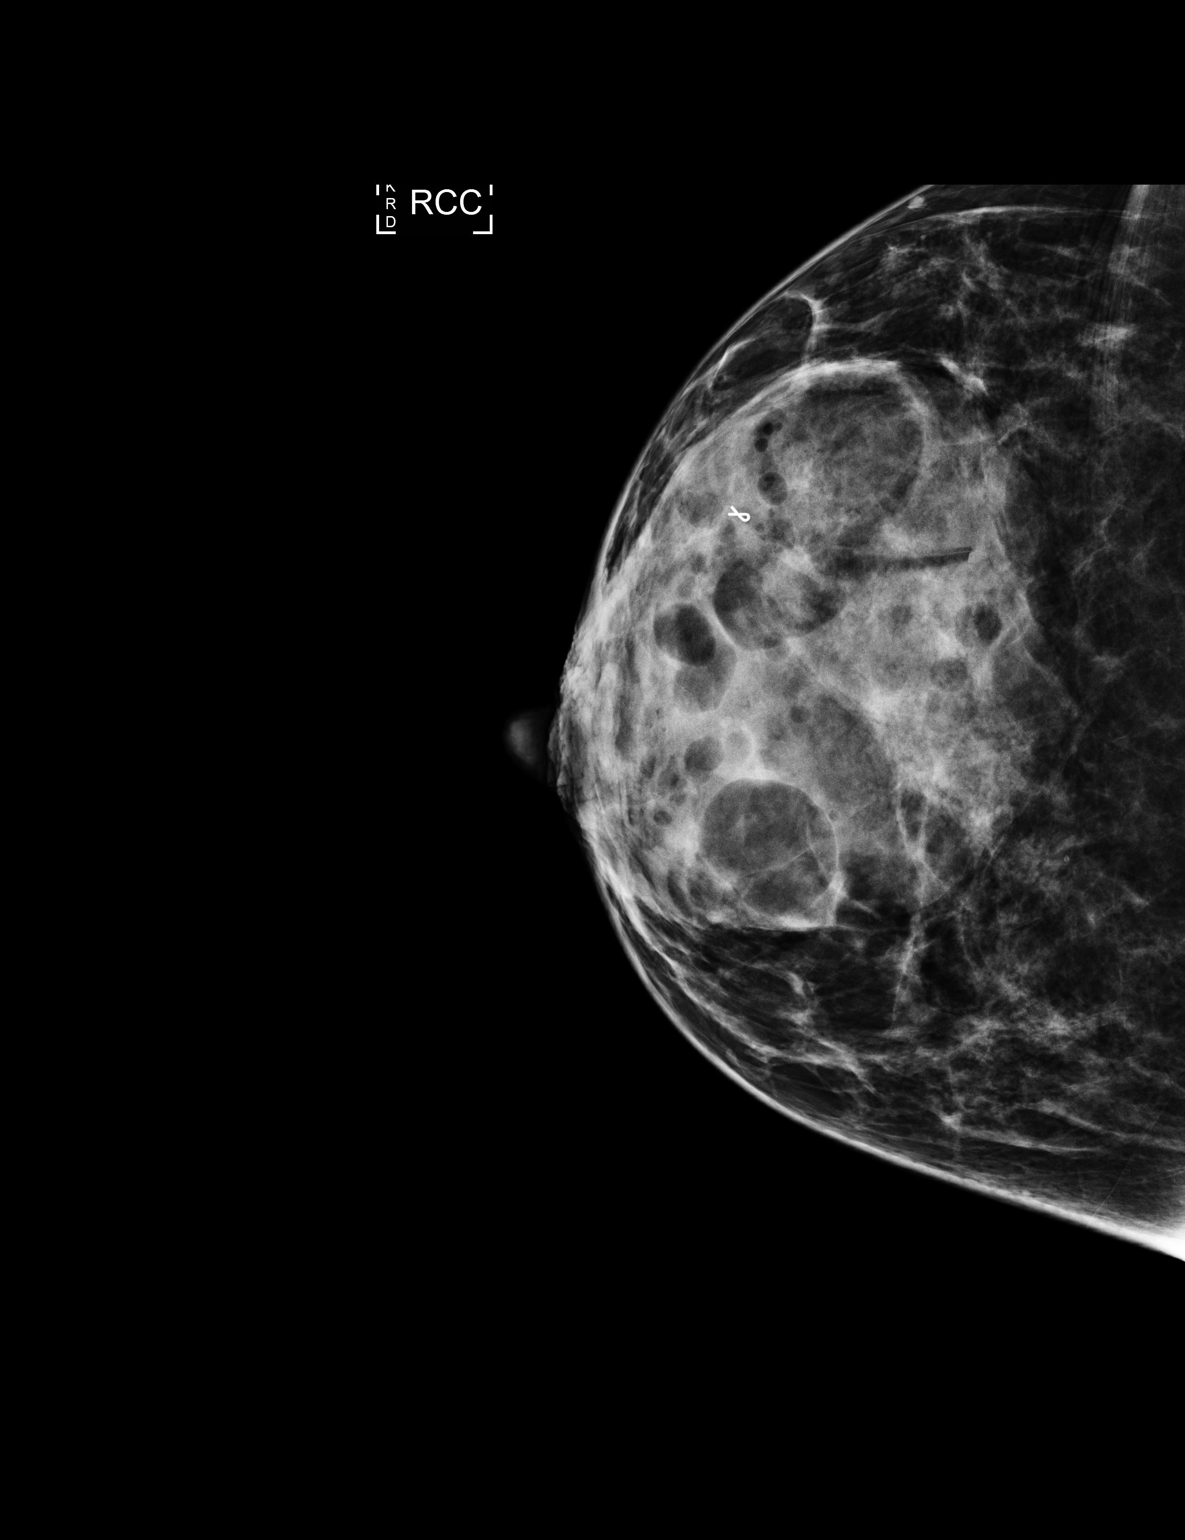

[R ML]
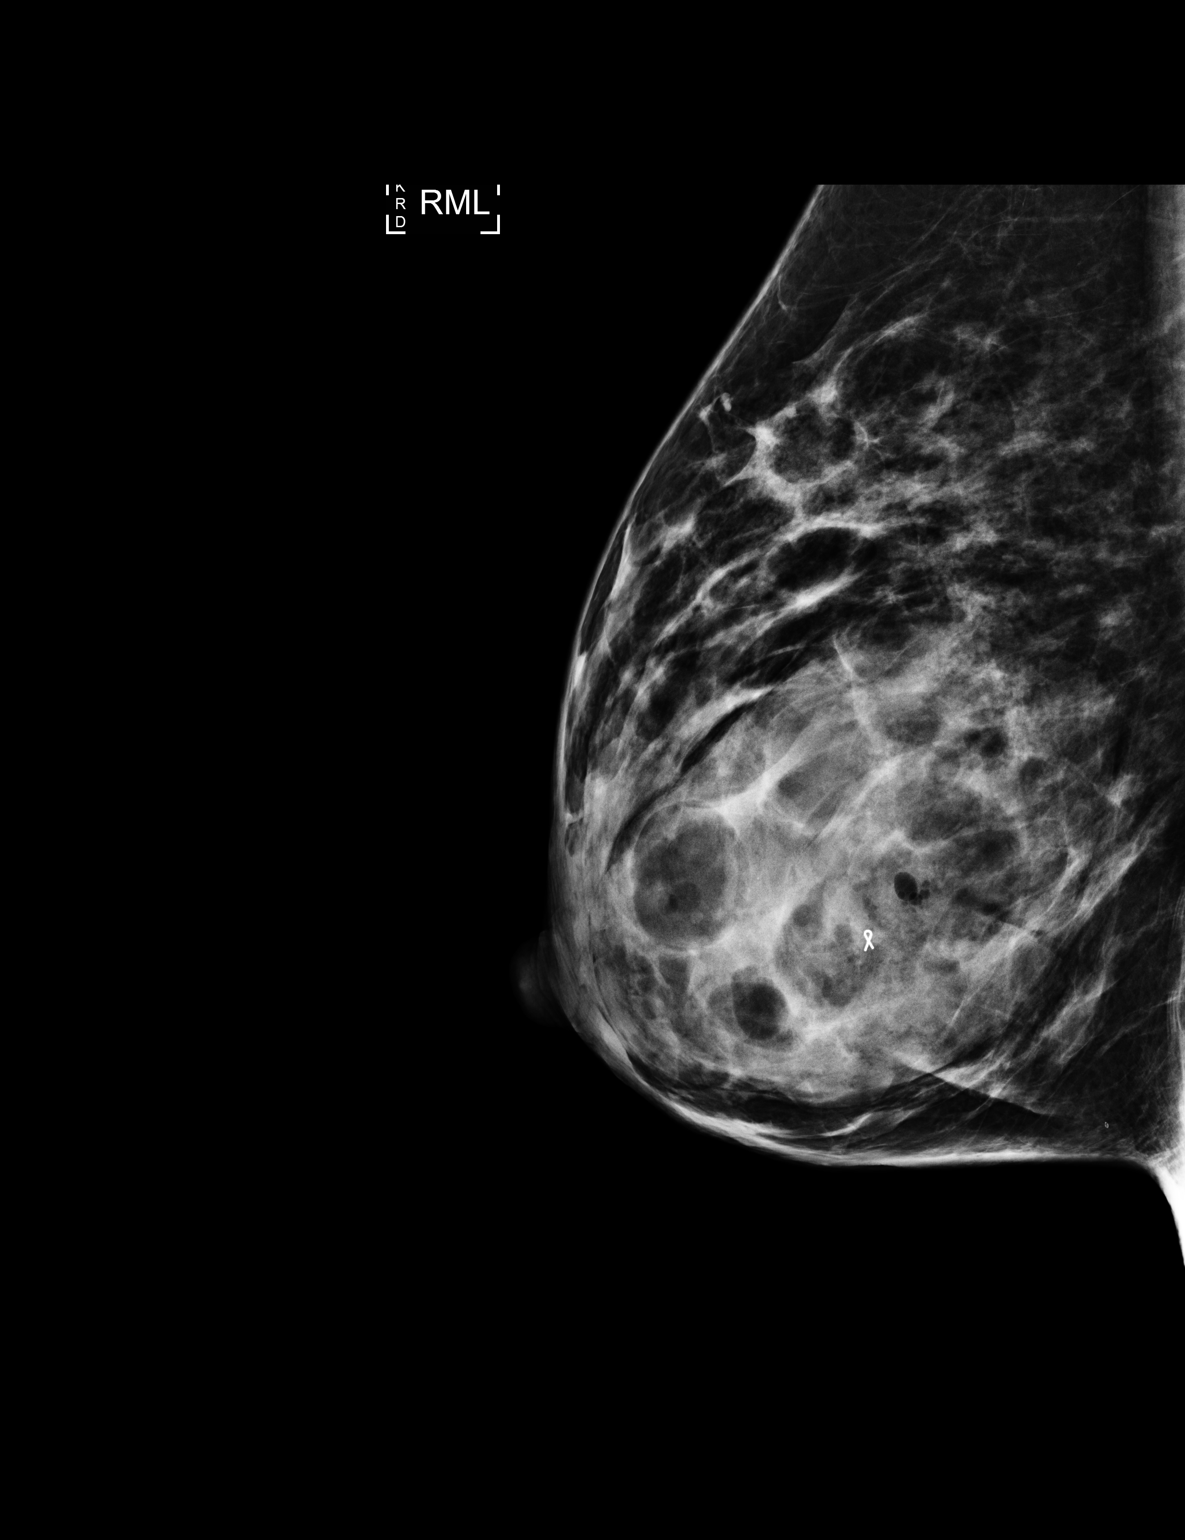

[2 of 2 positions shown; findings below may reference images not displayed]

FINDINGS: Mammographic images were obtained following ultrasound guided biopsy
of an indeterminate right breast mass at 9 o'clock. Post biopsy
mammogram demonstrates the ribbon shaped biopsy marker in the
expected location within the right breast mass.
IMPRESSION: Appropriate marker position as above.

Final Assessment: Post Procedure Mammograms for Marker Placement

## 2018-06-21 ENCOUNTER — Encounter: Payer: Self-pay | Admitting: Obstetrics and Gynecology

## 2018-06-21 ENCOUNTER — Ambulatory Visit (INDEPENDENT_AMBULATORY_CARE_PROVIDER_SITE_OTHER): Payer: 59 | Admitting: Obstetrics and Gynecology

## 2018-06-21 VITALS — BP 109/70 | HR 70 | Ht 63.0 in | Wt 154.8 lb

## 2018-06-21 DIAGNOSIS — Z01419 Encounter for gynecological examination (general) (routine) without abnormal findings: Secondary | ICD-10-CM | POA: Diagnosis not present

## 2018-06-21 DIAGNOSIS — Z13220 Encounter for screening for lipoid disorders: Secondary | ICD-10-CM

## 2018-06-21 DIAGNOSIS — Z23 Encounter for immunization: Secondary | ICD-10-CM | POA: Diagnosis not present

## 2018-06-21 DIAGNOSIS — Z975 Presence of (intrauterine) contraceptive device: Secondary | ICD-10-CM

## 2018-06-21 NOTE — Progress Notes (Signed)
GYNECOLOGY ANNUAL PHYSICAL EXAM PROGRESS NOTE  Subjective:    Jenny Delgado is a 27 y.o. [phone] female who presents for an annual exam. The patient has no complaints today. The patient is sexually active. The patient wears seatbelts: yes. The patient participates in regular exercise: no. Has the patient ever been transfused or tattooed?: no. The patient reports that there is not domestic violence in her life.    Gynecologic History  Menarche age: 79 No LMP recorded. (Menstrual status: IUD). Contraception: IUD History of STI's: Denies Last Pap: 06/24/2016. Results were: normal.  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    OB History  Gravida Para Term Preterm AB Living  6 2 2  0 4 2  SAB TAB Ectopic Multiple Live Births  3 0 0 0 2    # Outcome Date GA Lbr Len/2nd Weight Sex Delivery Anes PTL Lv  6 Term 05/11/16 [redacted]w[redacted]d  8 lb 9.9 oz (3.91 kg) F CS-LTranv Spinal  LIV     Name: Jenny Delgado     Apgar1: 8  Apgar5: 9  5 SAB 05/2015        FD  4 Term 09/20/07 [redacted]w[redacted]d  7 lb 8 oz (3.402 kg) M CS-LTranv EPI  LIV     Name: Jenny Delgado  3 AB           2 SAB           1 SAB             Past Medical History:  Diagnosis Date  . Anxiety   . Depression   . Headache(784.0)   . IUD contraception    mirena    Past Surgical History:  Procedure Laterality Date  . BREAST SURGERY    . CESAREAN SECTION  2008  . CESAREAN SECTION N/A 05/11/2016   Procedure: REPEAT CESAREAN SECTION;  Surgeon: Hildred Laser, MD;  Location: [facility] ORS;  Service: Obstetrics;  Laterality: N/A;  . WISDOM TOOTH EXTRACTION      Family History  Problem Relation Age of Onset  . Hypertension Father   . Atrial fibrillation Father   . Diabetes Maternal Grandfather   . Breast cancer Maternal Grandmother 80  . Cervical cancer Neg Hx   . Colon cancer Neg Hx   . Ovarian cancer Neg Hx   . Heart disease Neg Hx     Social History   Socioeconomic History  . Marital status: Married    Spouse  name: Not on file  . Number of children: Not on file  . Years of education: Not on file  . Highest education level: Not on file  Occupational History  . Not on file  Social Needs  . Financial resource strain: Not on file  . Food insecurity:    Worry: Not on file    Inability: Not on file  . Transportation needs:    Medical: Not on file    Non-medical: Not on file  Tobacco Use  . Smoking status: Never Smoker  . Smokeless tobacco: Never Used  Substance and Sexual Activity  . Alcohol use: Not Currently    Comment: ocassional wine coolers   . Drug use: No  . Sexual activity: Yes    Birth control/protection: IUD    Comment: Mirena 07/15/16  Lifestyle  . Physical activity:    Days per week: Not on file    Minutes per session: Not on file  . Stress: Not on file  Relationships  .  Social connections:    Talks on phone: Not on file    Gets together: Not on file    Attends religious service: Not on file    Active member of club or organization: Not on file    Attends meetings of clubs or organizations: Not on file    Relationship status: Not on file  . Intimate partner violence:    Fear of current or ex partner: Not on file    Emotionally abused: Not on file    Physically abused: Not on file    Forced sexual activity: Not on file  Other Topics Concern  . Not on file  Social History Narrative  . Not on file    Current Outpatient Medications on File Prior to Visit  Medication Sig Dispense Refill  . clonazePAM (KLONOPIN) 1 MG tablet Take 1 tablet (1 mg total) by mouth 2 (two) times daily as needed for anxiety. 30 tablet 4  . levonorgestrel (MIRENA) 20 MCG/24HR IUD 1 each by Intrauterine route once.    . ranitidine (ZANTAC) 150 MG capsule Take 150 mg by mouth 2 (two) times daily.    . [DISCONTINUED] misoprostol (CYTOTEC) 200 MCG tablet Take 4 tablets (800 mcg total) by mouth once. (Patient not taking: Reported on 06/03/2015) 4 tablet 1   No current facility-administered  medications on file prior to visit.     Allergies  Allergen Reactions  . Bactrim [Sulfamethoxazole-Trimethoprim] Hives, Rash and Other (See Comments)    Patient states she had a fever.  Jenny Delgado [Escitalopram Oxalate] Other (See Comments)    Facial numbness  . Zoloft [Sertraline Hcl] Other (See Comments)    Facial numbness      Review of Systems Constitutional: negative for chills, fatigue, fevers and sweats Eyes: negative for irritation, redness and visual disturbance Ears, nose, mouth, throat, and face: negative for hearing loss, nasal congestion, snoring and tinnitus Respiratory: negative for asthma, cough, sputum Cardiovascular: negative for chest pain, dyspnea, exertional chest pressure/discomfort, irregular heart beat, palpitations and syncope Gastrointestinal: negative for abdominal pain, change in bowel habits, nausea and vomiting Genitourinary: negative for abnormal menstrual periods, genital lesions, sexual problems and vaginal discharge, dysuria and urinary incontinence Integument/breast: negative for breast lump, breast tenderness and nipple discharge Hematologic/lymphatic: negative for bleeding and easy bruising Musculoskeletal:negative for back pain and muscle weakness Neurological: negative for dizziness, headaches, vertigo and weakness Endocrine: negative for diabetic symptoms including polydipsia, polyuria and skin dryness Allergic/Immunologic: negative for hay fever and urticaria       Objective:  Blood pressure 109/70, pulse 70, height 5\' 3"  (1.6 m), weight 154 lb 12.8 oz (70.2 kg), currently breastfeeding. Body mass index is 27.42 kg/m.  General Appearance:    Alert, cooperative, no distress, appears stated age  Head:    Normocephalic, without obvious abnormality, atraumatic  Eyes:    PERRL, conjunctiva/corneas clear, EOM's intact, both eyes  Ears:    Normal external ear canals, both ears  Nose:   Nares normal, septum midline, mucosa normal, no drainage or  sinus tenderness  Throat:   Lips, mucosa, and tongue normal; teeth and gums normal  Neck:   Supple, symmetrical, trachea midline, no adenopathy; thyroid: no enlargement/tenderness/nodules; no carotid bruit or JVD  Back:     Symmetric, no curvature, ROM normal, no CVA tenderness  Lungs:     Clear to auscultation bilaterally, respirations unlabored  Chest Wall:    No tenderness or deformity   Heart:    Regular rate and rhythm, S1 and S2  normal, no murmur, rub or gallop  Breast Exam:    No tenderness, masses, or nipple abnormality  Abdomen:     Soft, non-tender, bowel sounds active all four quadrants, no masses, no organomegaly.  Well-healed Pfannenstiel incision.   Genitalia:    Pelvic:external genitalia normal, vagina without lesions, discharge, or tenderness, rectovaginal septum  normal. Cervix normal in appearance, no cervical motion tenderness, no adnexal masses or tenderness.  Uterus normal size, shape, mobile, regular contours, nontender.  Rectal:    Normal external sphincter.  No hemorrhoids appreciated. Internal exam not done.   Extremities:   Extremities normal, atraumatic, no cyanosis or edema  Pulses:   2+ and symmetric all extremities  Skin:   Skin color, texture, turgor normal, no rashes or lesions  Lymph nodes:   Cervical, supraclavicular, and axillary nodes normal  Neurologic:   CNII-XII intact, normal strength, sensation and reflexes throughout   .  Labs:  Lab Results  Component Value Date   WBC 7.4 05/24/2016   HGB 12.5 06/24/2016   HCT 37.9 06/24/2016   MCV 78.0 (L) 05/24/2016   PLT 218 05/24/2016    Lab Results  Component Value Date   CREATININE 0.43 (L) 05/24/2016   BUN 13 05/24/2016   NA 140 05/24/2016   K 4.0 05/24/2016   CL 112 (H) 05/24/2016   CO2 22 05/24/2016    Lab Results  Component Value Date   ALT 33 05/22/2016   AST 21 05/22/2016   ALKPHOS 101 05/22/2016   BILITOT 0.3 05/22/2016    No results found for: TSH   Assessment:    Healthy  female exam.   IUD in place  Plan:   Labs: CBC, CMP, and lipid panel (at patient's request) ordered.   Breast self exam technique reviewed and patient encouraged to perform self-exam monthly. Contraception: IUD. Discussed healthy lifestyle modifications. Pap smear up to date. Next due in 1 year.  Follow up in 1 year for annual exam.  Flu vaccine given today.    Hildred Laser, MD [facility]

## 2018-06-21 NOTE — Progress Notes (Signed)
PT is present today for her annual exam. Pt stated that she is doing well no complaints.   

## 2018-06-22 LAB — LIPID PANEL
Chol/HDL Ratio: 3.3 ratio (ref 0.0–4.4)
Cholesterol, Total: 128 mg/dL (ref 100–199)
HDL: 39 mg/dL — ABNORMAL LOW (ref >39)
LDL CALC: 76 mg/dL (ref 0–99)
Triglycerides: 67 mg/dL (ref 0–149)
VLDL Cholesterol Cal: 13 mg/dL (ref 5–40)

## 2018-06-22 LAB — COMPREHENSIVE METABOLIC PANEL
A/G RATIO: 2 (ref 1.2–2.2)
ALBUMIN: 4.5 g/dL (ref 3.5–5.5)
ALT: 11 IU/L (ref 0–32)
AST: 14 IU/L (ref 0–40)
Alkaline Phosphatase: 58 IU/L (ref 39–117)
BUN/Creatinine Ratio: 12 (ref 9–23)
BUN: 8 mg/dL (ref 6–20)
Bilirubin Total: 0.4 mg/dL (ref 0.0–1.2)
CALCIUM: 9.3 mg/dL (ref 8.7–10.2)
CO2: 23 mmol/L (ref 20–29)
Chloride: 104 mmol/L (ref 96–106)
Creatinine, Ser: 0.69 mg/dL (ref 0.57–1.00)
GFR, EST AFRICAN AMERICAN: 136 mL/min/{1.73_m2} (ref >59)
GFR, EST NON AFRICAN AMERICAN: 118 mL/min/{1.73_m2} (ref >59)
GLOBULIN, TOTAL: 2.3 g/dL (ref 1.5–4.5)
Glucose: 81 mg/dL (ref 65–99)
POTASSIUM: 3.9 mmol/L (ref 3.5–5.2)
SODIUM: 142 mmol/L (ref 134–144)
Total Protein: 6.8 g/dL (ref 6.0–8.5)

## 2018-06-22 LAB — CBC
HEMOGLOBIN: 13.2 g/dL (ref 11.1–15.9)
Hematocrit: 38.1 % (ref 34.0–46.6)
MCH: 28 pg (ref 26.6–33.0)
MCHC: 34.6 g/dL (ref 31.5–35.7)
MCV: 81 fL (ref 79–97)
Platelets: 177 10*3/uL (ref 150–450)
RBC: 4.71 x10E6/uL (ref 3.77–5.28)
RDW: 15 % (ref 12.3–15.4)
WBC: 4.1 10*3/uL (ref 3.4–10.8)

## 2018-10-18 HISTORY — PX: HEMORRHOID BANDING: SHX5850

## 2018-10-18 IMAGING — US US BREAST*R* LIMITED INC AXILLA
1 series · 13 of 20 positions shown · non-contrast
Comparison: None.

CLINICAL DATA: 25-year-old female presenting for evaluation of a
palpable mass in the right breast. The patient first noticed this at
the time of pregnancy and states that it is definitely new, but
presumed to be related to pregnancy related changes. She stopped
breast feeding in Saturday September, 2016, however the mass is still
palpable.

EXAM:
2D DIGITAL DIAGNOSTIC UNILATERAL RIGHT MAMMOGRAM WITH CAD AND
ADJUNCT TOMO
RIGHT BREAST ULTRASOUND

[Series 1: us breast*right* limited inc axilla · 0.10mm/px · 13 of 20 slices shown]
[im 1/20]
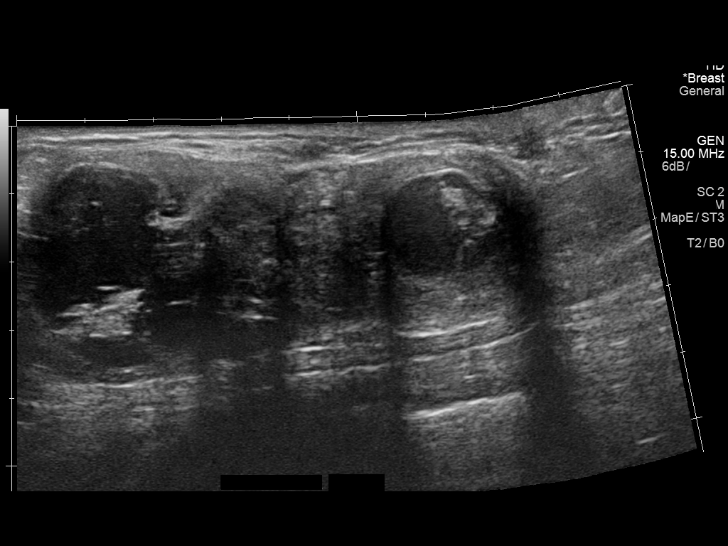
[im 3/20]
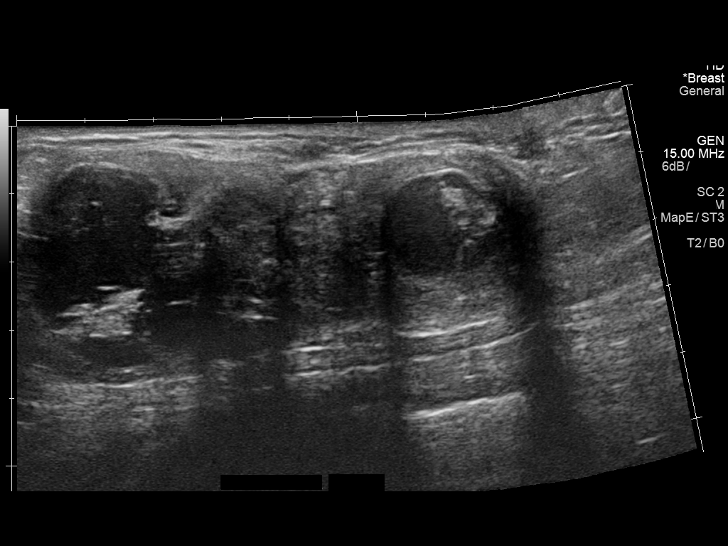
[im 4/20]
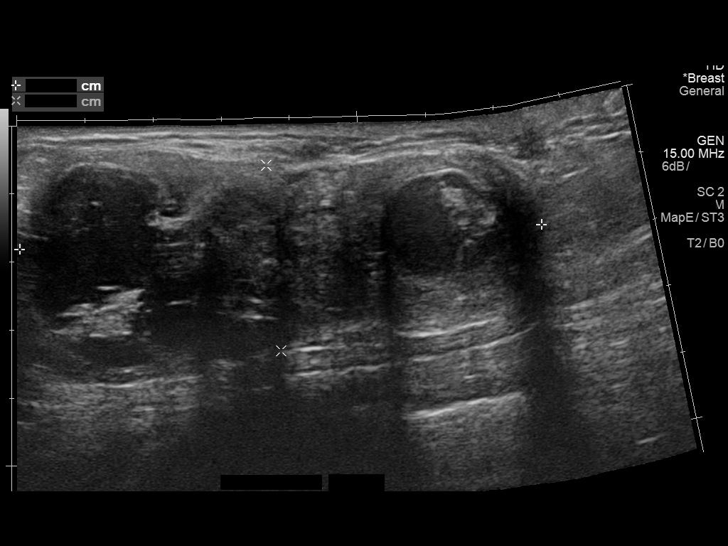
[im 6/20]
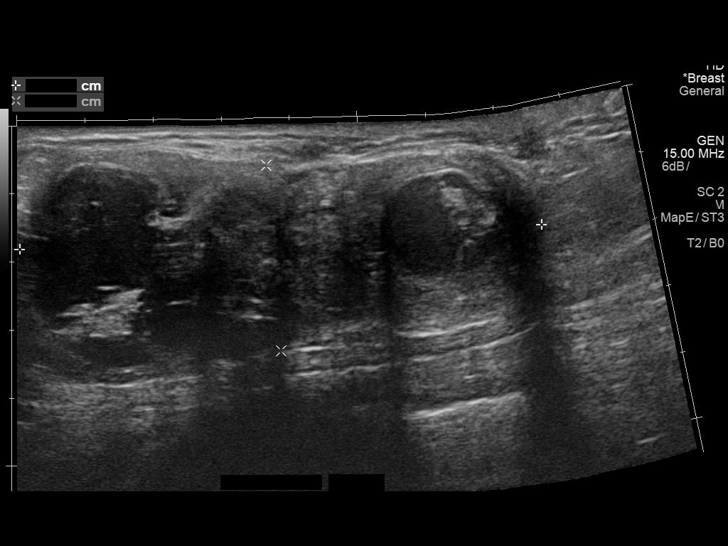
[im 7/20]
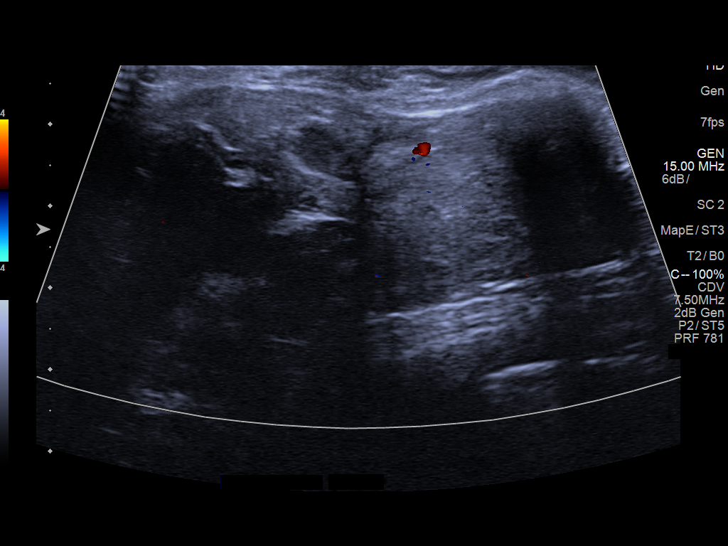
[im 9/20]
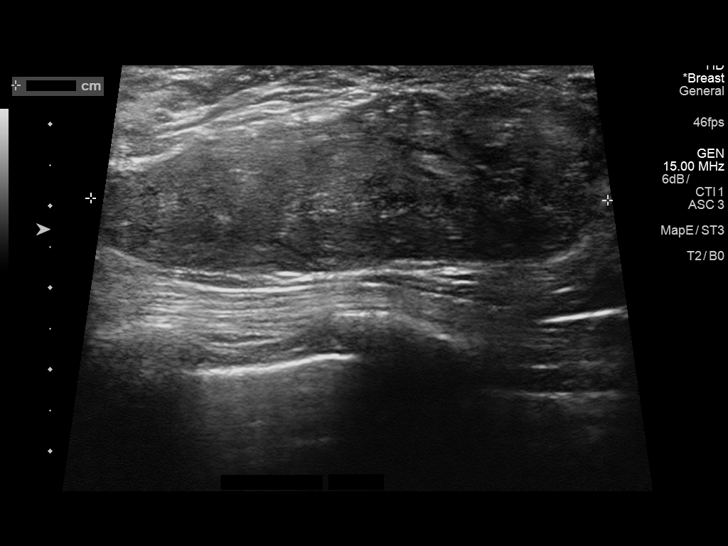
[im 11/20]
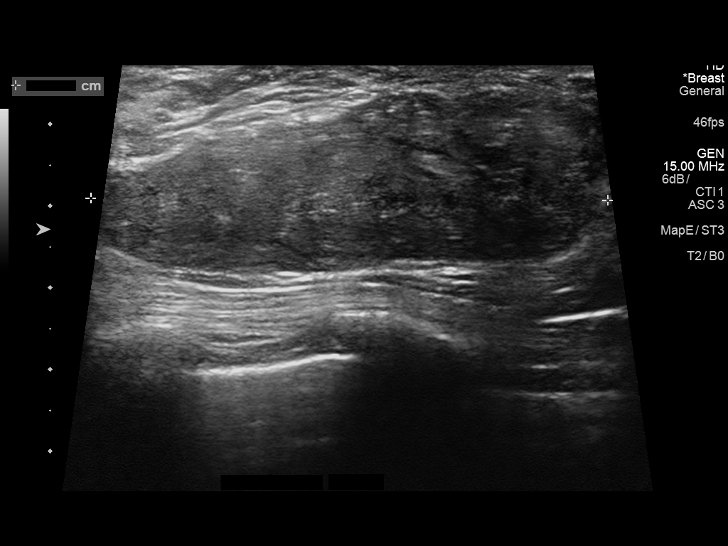
[im 12/20]
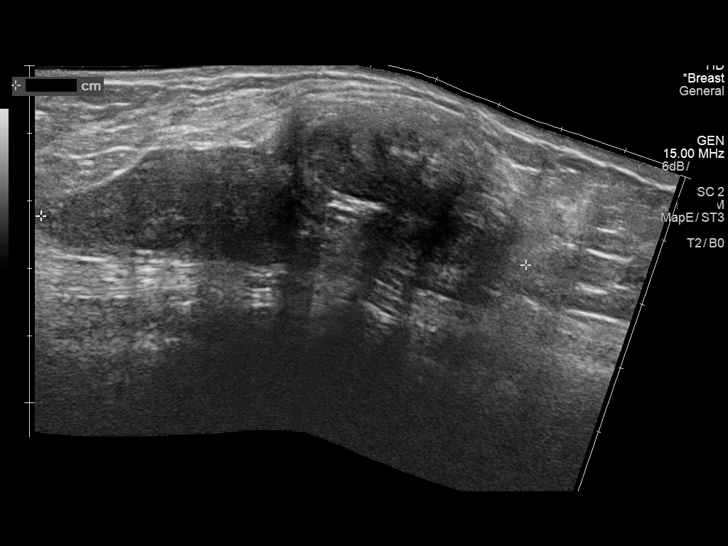
[im 14/20]
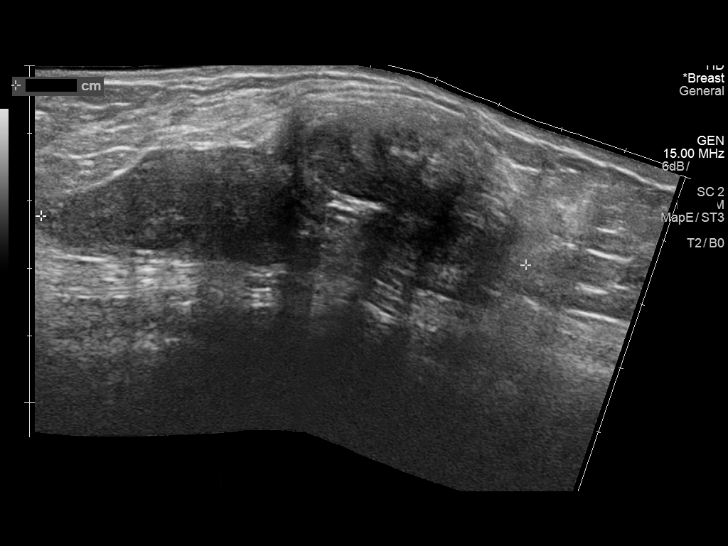
[im 15/20]
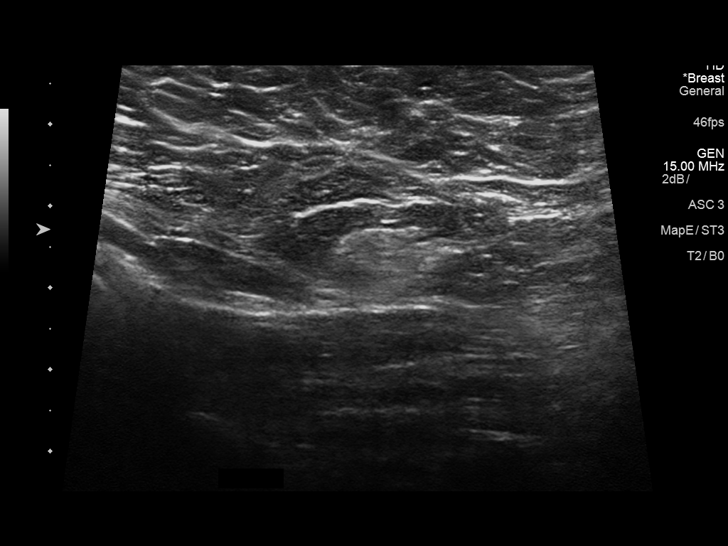
[im 17/20]
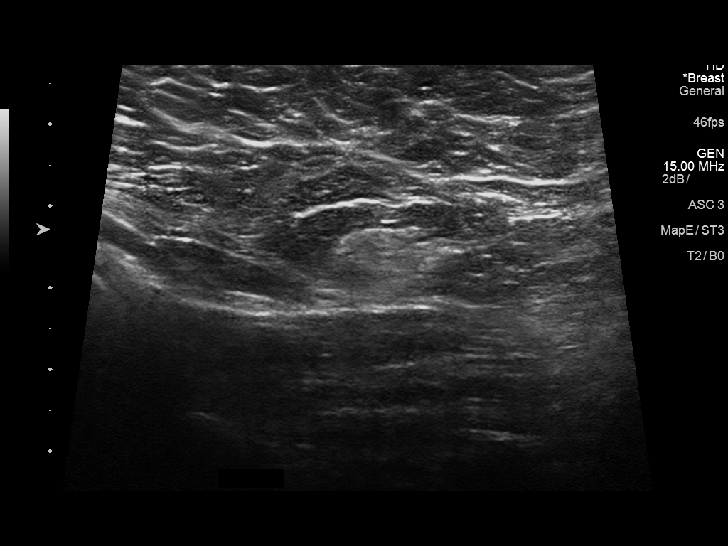
[im 18/20]
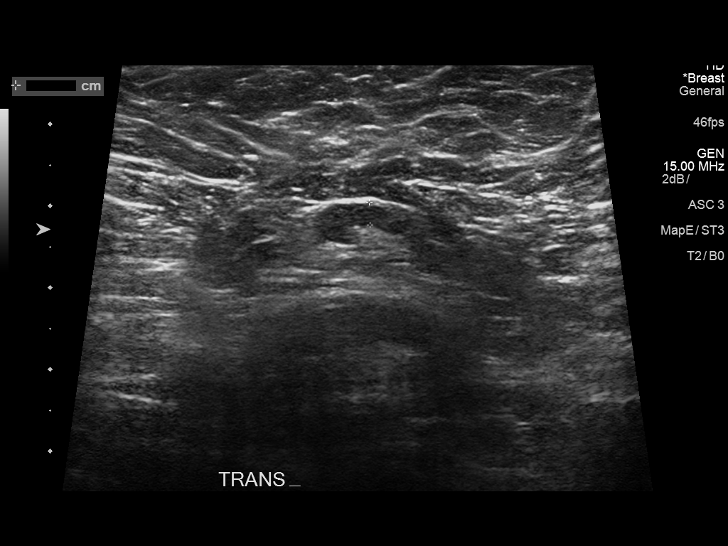
[im 20/20]
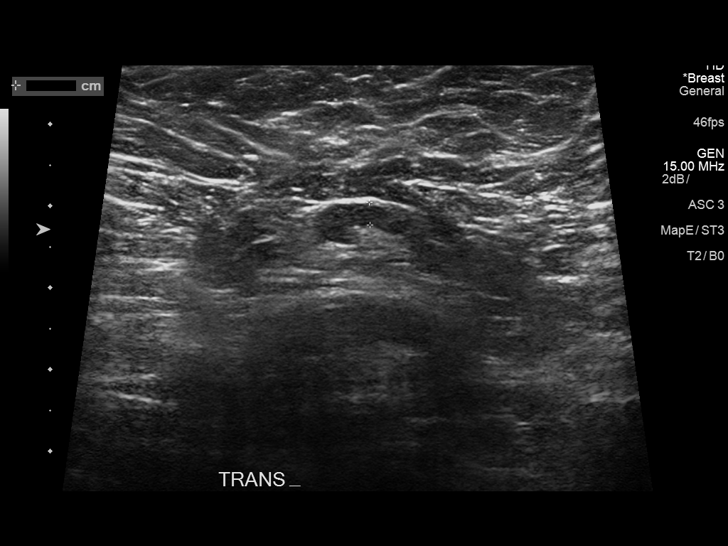

[13 of 20 positions shown; findings below may reference images not displayed]

ACR Breast Density Category c: The breast tissue is heterogeneously
dense, which may obscure small masses.
FINDINGS: Physical exam of the palpable site demonstrates that the majority of
the bulk of this mass is at the 9 o'clock position, but extends
inferior and slightly medial to the nipple. The mass is smooth and
highly mobile.

Initially, ultrasound of the right breast was performed
demonstrating a large heterogeneous mass measuring approximately
x 2.7 x 7.2 cm. There is mild vascularity identified on color
Doppler imaging. Ultrasound of the right axilla demonstrates
normal-appearing lymph nodes.

Subsequently, a unilateral right breast mammogram was performed
demonstrating a large heterogeneous mass with large fat filled
components, signifying that the process is probably benign. By
mammogram, the mass measures approximately 7.8 x 7.0 x 7.7 cm.

Mammographic images were processed with CAD.
IMPRESSION: Given the large fatty components of the mass, this is likely benign.
However, given its size and persistence ultrasound-guided biopsy is
recommended.

RECOMMENDATION:
Ultrasound-guided biopsy is recommended for the palpable right
breast mass.

I have discussed the findings and recommendations with the patient.
Results were also provided in writing at the conclusion of the
visit. If applicable, a reminder letter will be sent to the patient
regarding the next appointment.

BI-RADS CATEGORY  4: Suspicious.

## 2019-05-24 ENCOUNTER — Ambulatory Visit (INDEPENDENT_AMBULATORY_CARE_PROVIDER_SITE_OTHER): Payer: 59 | Admitting: Internal Medicine

## 2019-05-24 ENCOUNTER — Encounter: Payer: Self-pay | Admitting: Internal Medicine

## 2019-05-24 VITALS — BP 104/66 | HR 72 | Temp 97.9°F | Ht 63.0 in | Wt 151.0 lb

## 2019-05-24 DIAGNOSIS — K5901 Slow transit constipation: Secondary | ICD-10-CM | POA: Diagnosis not present

## 2019-05-24 DIAGNOSIS — K641 Second degree hemorrhoids: Secondary | ICD-10-CM | POA: Diagnosis not present

## 2019-05-24 NOTE — Assessment & Plan Note (Signed)
Anorectal exam NL today Try MiraLAx qd

## 2019-05-24 NOTE — Patient Instructions (Addendum)
HEMORRHOID BANDING PROCEDURE    FOLLOW-UP CARE   1. The procedure you have had should have been relatively painless since the banding of the area involved does not have nerve endings and there is no pain sensation.  The rubber band cuts off the blood supply to the hemorrhoid and the band may fall off as soon as 48 hours after the banding (the band may occasionally be seen in the toilet bowl following a bowel movement). You may notice a temporary feeling of fullness in the rectum which should respond adequately to plain Tylenol or Motrin.  2. Following the banding, avoid strenuous exercise that evening and resume full activity the next day.  A sitz bath (soaking in a warm tub) or bidet is soothing, and can be useful for cleansing the area after bowel movements.     3. To avoid constipation, take two tablespoons of natural wheat bran, natural oat bran, flax, Benefiber or any over the counter fiber supplement and increase your water intake to 7-8 glasses daily.  Handout provided.  4. Unless you have been prescribed anorectal medication, do not put anything inside your rectum for two weeks: No suppositories, enemas, fingers, etc.  5. Occasionally, you may have more bleeding than usual after the banding procedure.  This is often from the untreated hemorrhoids rather than the treated one.  Don't be concerned if there is a tablespoon or so of blood.  If there is more blood than this, lie flat with your bottom higher than your head and apply an ice pack to the area. If the bleeding does not stop within a half an hour or if you feel faint, call our office at [phone] or go to the emergency room.  6. Problems are not common; however, if there is a substantial amount of bleeding, severe pain, chills, fever or difficulty passing urine (very rare) or other problems, you should call us at [phone] or report to the nearest emergency room.  7. Do not stay seated continuously for more than 2-3 hours  for a day or two after the procedure.  Tighten your buttock muscles 10-15 times every two hours and take 10-15 deep breaths every 1-2 hours.  Do not spend more than a few minutes on the toilet if you cannot empty your bowel; instead re-visit the toilet at a later time.  Take Miralax daily, samples provided.   We will see you again on 06/07/2019 at 2:50pm    I appreciate the opportunity to care for you. Silvano Rusk, MD, [facility]

## 2019-05-24 NOTE — Progress Notes (Signed)
Jenny Delgado 28 y.o. [birth year] [id number]  Assessment & Plan:   Prolapsed internal hemorrhoids, grade 2 Sxs and signs are rectal bleeding, anal discomfort, anal itching and prolapse RP banded today RTC 2-4 weeks repeat banding of other columns tx constipation with MiraLAx  Slow transit constipation Anorectal exam NL today Try MiraLAx qd  I appreciate the opportunity to care for this patient. 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  Seemed to develop after the birth of her first child in 2008.  She also moves her bowels only about 2 or 3 times a week "my whole life".  Sometimes she will use fiber changed her diet increase fluid intake but that has never really seem to help.  She is occasionally tried MiraLAX but she has been concerned about using something regularly as she would not want to be dependent.  Occasional abdominal cramping but not predominant.  No previous colonoscopy or significant GI history I believe this is her first visit to a gastroenterologist.  GI review of systems otherwise negative except for occasional heartburn. Allergies  Allergen Reactions  . Bactrim [Sulfamethoxazole-Trimethoprim] Hives, Rash and Other (See Comments)    Patient states she had a fever.  Loma Sousa [Escitalopram Oxalate] Other (See Comments)    Facial numbness  . Zoloft [Sertraline Hcl] Other (See Comments)    Facial numbness   Current Meds  Medication Sig  . levonorgestrel (MIRENA) 20 MCG/24HR IUD 1 each by Intrauterine route once.   Past Medical History:  Diagnosis Date  . Anxiety   . Depression   . Headache(784.0)   . IUD contraception    mirena   Past Surgical History:  Procedure Laterality Date  .  BREAST SURGERY    . CESAREAN SECTION  2008  . CESAREAN SECTION N/A 05/11/2016   Procedure: REPEAT CESAREAN SECTION;  Surgeon: Jenny Maid, MD;  Location: [facility] ORS;  Service: Obstetrics;  Laterality: N/A;  . 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   Review of Systems Sxs of anxiety and depression, fatigue, insomnia and headaches All other ROS negative  Objective:   Physical Exam BP 104/66   Pulse 72   Temp 97.9 F (36.6 C) (Temporal)   Ht 5\' 3"  (1.6 m)   Wt 151 lb (68.5 kg)   BMI 26.75 kg/m  NAD Eyes anicteric Lungs CTA Cor S1S2 no rmg abd soft and nontender BS no masses - pierced navel with stud skin no rash Appropriate mood/affect   [name], [place] present.   Anoderm inspection revealed small RA anal tag Anal wink was + Digital exam revealed normal resting tone and voluntary squeeze. No mass or rectocele present. Simulated defecation with valsalva revealed appropriate abdominal contraction and descent.    Anoscopy - Gr 2 internal hemorrhoids all positions, not inflamed  PROCEDURE NOTE: The patient presents with symptomatic grade 2 hemorrhoids, requesting rubber band ligation of his/her hemorrhoidal disease.  All risks, benefits and alternative forms of therapy were described and informed consent was obtained.   The anorectum was pre-medicated with 0.125% NTG and 5% lidocaine The decision was made to band the RP internal hemorrhoid, and the [facility] was used to perform band ligation without complication.   Digital anorectal examination was then performed to assure proper positioning of the band, and to adjust the banded tissue as required.  The patient was discharged home without pain or other issues.  Dietary and behavioral recommendations were given and along with follow-up instructions.     The following adjunctive treatments were recommended:  Miralax qd  The patient will return in 2-4 weeks for  follow-up and possible additional banding as required. No complications were encountered and the patient tolerated the procedure well.

## 2019-05-24 NOTE — Assessment & Plan Note (Signed)
Sxs and signs are rectal bleeding, anal discomfort, anal itching and prolapse RP banded today RTC 2-4 weeks repeat banding of other columns tx constipation with MiraLAx

## 2019-06-07 ENCOUNTER — Ambulatory Visit (INDEPENDENT_AMBULATORY_CARE_PROVIDER_SITE_OTHER): Payer: 59 | Admitting: Internal Medicine

## 2019-06-07 ENCOUNTER — Encounter: Payer: Self-pay | Admitting: Internal Medicine

## 2019-06-07 DIAGNOSIS — K641 Second degree hemorrhoids: Secondary | ICD-10-CM | POA: Diagnosis not present

## 2019-06-07 NOTE — Assessment & Plan Note (Addendum)
RA and LL banded today F/U prn

## 2019-06-07 NOTE — Patient Instructions (Signed)
HEMORRHOID BANDING PROCEDURE    FOLLOW-UP CARE   1. The procedure you have had should have been relatively painless since the banding of the area involved does not have nerve endings and there is no pain sensation.  The rubber band cuts off the blood supply to the hemorrhoid and the band may fall off as soon as 48 hours after the banding (the band may occasionally be seen in the toilet bowl following a bowel movement). You may notice a temporary feeling of fullness in the rectum which should respond adequately to plain Tylenol or Motrin.  2. Following the banding, avoid strenuous exercise that evening and resume full activity the next day.  A sitz bath (soaking in a warm tub) or bidet is soothing, and can be useful for cleansing the area after bowel movements.     3. To avoid constipation, take two tablespoons of natural wheat bran, natural oat bran, flax, Benefiber or any over the counter fiber supplement and increase your water intake to 7-8 glasses daily.    4. Unless you have been prescribed anorectal medication, do not put anything inside your rectum for two weeks: No suppositories, enemas, fingers, etc.  5. Occasionally, you may have more bleeding than usual after the banding procedure.  This is often from the untreated hemorrhoids rather than the treated one.  Don't be concerned if there is a tablespoon or so of blood.  If there is more blood than this, lie flat with your bottom higher than your head and apply an ice pack to the area. If the bleeding does not stop within a half an hour or if you feel faint, call our office at [phone] or go to the emergency room.  6. Problems are not common; however, if there is a substantial amount of bleeding, severe pain, chills, fever or difficulty passing urine (very rare) or other problems, you should call us at [phone] or report to the nearest emergency room.  7. Do not stay seated continuously for more than 2-3 hours for a day or two  after the procedure.  Tighten your buttock muscles 10-15 times every two hours and take 10-15 deep breaths every 1-2 hours.  Do not spend more than a few minutes on the toilet if you cannot empty your bowel; instead re-visit the toilet at a later time.    Follow up as needed with Dr Gessner.    I appreciate the opportunity to care for you. Carl Gessner, MD, FACG 

## 2019-06-07 NOTE — Progress Notes (Signed)
  HEMORRHOID LIGATION  Sxs and signs are rectal bleeding, anal discomfort, anal itching and prolapse   S/p rp BANDING 05/24/2019  Jackolyn Confer CMA present   PROCEDURE NOTE: The patient presents with symptomatic grade 2  hemorrhoids, requesting rubber band ligation of his/her hemorrhoidal disease.  All risks, benefits and alternative forms of therapy were described and informed consent was obtained.   The anorectum was pre-medicated with 5% lidocaine and 0.125% NTG The decision was made to band the RA and LL internal hemorrhoids, and the [place] was used to perform band ligation without complication.  Digital anorectal examination was then performed to assure proper positioning of the band, and to adjust the banded tissue as required.  The patient was discharged home without pain or other issues.  Dietary and behavioral recommendations were given and along with follow-up instructions.     The patient will return in prn for  follow-up and possible additional banding as required. No complications were encountered and the patient tolerated the procedure well.  IW:[id number], Dolphus Jenny, MD

## 2019-06-26 NOTE — Patient Instructions (Signed)
Preventive Care 21-30 Years Old, Female Preventive care refers to visits with your health care provider and lifestyle choices that can promote health and wellness. This includes:  A yearly physical exam. This may also be called an annual well check.  Regular dental visits and eye exams.  Immunizations.  Screening for certain conditions.  Healthy lifestyle choices, such as eating a healthy diet, getting regular exercise, not using drugs or products that contain nicotine and tobacco, and limiting alcohol use. What can I expect for my preventive care visit? Physical exam Your health care provider will check your:  Height and weight. This may be used to calculate body mass index (BMI), which tells if you are at a healthy weight.  Heart rate and blood pressure.  Skin for abnormal spots. Counseling Your health care provider may ask you questions about your:  Alcohol, tobacco, and drug use.  Emotional well-being.  Home and relationship well-being.  Sexual activity.  Eating habits.  Work and work environment.  Method of birth control.  Menstrual cycle.  Pregnancy history. What immunizations do I need?  Influenza (flu) vaccine  This is recommended every year. Tetanus, diphtheria, and pertussis (Tdap) vaccine  You may need a Td booster every 10 years. Varicella (chickenpox) vaccine  You may need this if you have not been vaccinated. Human papillomavirus (HPV) vaccine  If recommended by your health care provider, you may need three doses over 6 months. Measles, mumps, and rubella (MMR) vaccine  You may need at least one dose of MMR. You may also need a second dose. Meningococcal conjugate (MenACWY) vaccine  One dose is recommended if you are age 19-21 years and a first-year college student living in a residence hall, or if you have one of several medical conditions. You may also need additional booster doses. Pneumococcal conjugate (PCV13) vaccine  You may need  this if you have certain conditions and were not previously vaccinated. Pneumococcal polysaccharide (PPSV23) vaccine  You may need one or two doses if you smoke cigarettes or if you have certain conditions. Hepatitis A vaccine  You may need this if you have certain conditions or if you travel or work in places where you may be exposed to hepatitis A. Hepatitis B vaccine  You may need this if you have certain conditions or if you travel or work in places where you may be exposed to hepatitis B. Haemophilus influenzae type b (Hib) vaccine  You may need this if you have certain conditions. You may receive vaccines as individual doses or as more than one vaccine together in one shot (combination vaccines). Talk with your health care provider about the risks and benefits of combination vaccines. What tests do I need?  Blood tests  Lipid and cholesterol levels. These may be checked every 5 years starting at age 20.  Hepatitis C test.  Hepatitis B test. Screening  Diabetes screening. This is done by checking your blood sugar (glucose) after you have not eaten for a while (fasting).  Sexually transmitted disease (STD) testing.  BRCA-related cancer screening. This may be done if you have a family history of breast, ovarian, tubal, or peritoneal cancers.  Pelvic exam and Pap test. This may be done every 3 years starting at age 21. Starting at age 30, this may be done every 5 years if you have a Pap test in combination with an HPV test. Talk with your health care provider about your test results, treatment options, and if necessary, the need for more tests.   Follow these instructions at home: °Eating and drinking ° °· Eat a diet that includes fresh fruits and vegetables, whole grains, lean protein, and low-fat dairy. °· Take vitamin and mineral supplements as recommended by your health care provider. °· Do not drink alcohol if: °? Your health care provider tells you not to drink. °? You are  pregnant, may be pregnant, or are planning to become pregnant. °· If you drink alcohol: °? Limit how much you have to 0-1 drink a day. °? Be aware of how much alcohol is in your drink. In the [place] one drink equals one 12 oz bottle of beer (355 mL), one 5 oz glass of wine (148 mL), or one 1½ oz glass of hard liquor (44 mL). °Lifestyle °· Take daily care of your teeth and gums. °· Stay active. Exercise for at least 30 minutes on 5 or more days each week. °· Do not use any products that contain nicotine or tobacco, such as cigarettes, e-cigarettes, and chewing tobacco. If you need help quitting, ask your health care provider. °· If you are sexually active, practice safe sex. Use a condom or other form of birth control (contraception) in order to prevent pregnancy and STIs (sexually transmitted infections). If you plan to become pregnant, see your health care provider for a preconception visit. °What's next? °· Visit your health care provider once a year for a well check visit. °· Ask your health care provider how often you should have your eyes and teeth checked. °· Stay up to date on all vaccines. °This information is not intended to replace advice given to you by your health care provider. Make sure you discuss any questions you have with your health care provider. °Document Released: 11/30/2001 Document Revised: 06/15/2018 Document Reviewed: 06/15/2018 °Elsevier Patient Education © 2020 Elsevier Inc. °Breast Self-Awareness °Breast self-awareness is knowing how your breasts look and feel. Doing breast self-awareness is important. It allows you to catch a breast problem early while it is still small and can be treated. All women should do breast self-awareness, including women who have had breast implants. Tell your doctor if you notice a change in your breasts. °What you need: °· A mirror. °· A well-lit room. °How to do a breast self-exam °A breast self-exam is one way to learn what is normal for your breasts and to  check for changes. To do a breast self-exam: °Look for changes ° °1. Take off all the clothes above your waist. °2. Stand in front of a mirror in a room with good lighting. °3. Put your hands on your hips. °4. Push your hands down. °5. Look at your breasts and nipples in the mirror to see if one breast or nipple looks different from the other. Check to see if: °? The shape of one breast is different. °? The size of one breast is different. °? There are wrinkles, dips, and bumps in one breast and not the other. °6. Look at each breast for changes in the skin, such as: °? Redness. °? Scaly areas. °7. Look for changes in your nipples, such as: °? Liquid around the nipples. °? Bleeding. °? Dimpling. °? Redness. °? A change in where the nipples are. °Feel for changes ° °1. Lie on your back on the floor. °2. Feel each breast. To do this, follow these steps: °? Pick a breast to feel. °? Put the arm closest to that breast above your head. °? Use your other arm to feel the nipple area of   your breast. Feel the area with the pads of your three middle fingers by making small circles with your fingers. For the first circle, press lightly. For the second circle, press harder. For the third circle, press even harder. °? Keep making circles with your fingers at the different pressures as you move down your breast. Stop when you feel your ribs. °? Move your fingers a little toward the center of your body. °? Start making circles with your fingers again, this time going up until you reach your collarbone. °? Keep making up-and-down circles until you reach your armpit. Remember to keep using the three pressures. °? Feel the other breast in the same way. °3. Sit or stand in the tub or shower. °4. With soapy water on your skin, feel each breast the same way you did in step 2 when you were lying on the floor. °Write down what you find °Writing down what you find can help you remember what to tell your doctor. Write down: °· What is  normal for each breast. °· Any changes you find in each breast, including: °? The kind of changes you find. °? Whether you have pain. °? Size and location of any lumps. °· When you last had your menstrual period. °General tips °· Check your breasts every month. °· If you are breastfeeding, the best time to check your breasts is after you feed your baby or after you use a breast pump. °· If you get menstrual periods, the best time to check your breasts is 5-7 days after your menstrual period is over. °· With time, you will become comfortable with the self-exam, and you will begin to know if there are changes in your breasts. °Contact a doctor if you: °· See a change in the shape or size of your breasts or nipples. °· See a change in the skin of your breast or nipples, such as red or scaly skin. °· Have fluid coming from your nipples that is not normal. °· Find a lump or thick area that was not there before. °· Have pain in your breasts. °· Have any concerns about your breast health. °Summary °· Breast self-awareness includes looking for changes in your breasts, as well as feeling for changes within your breasts. °· Breast self-awareness should be done in front of a mirror in a well-lit room. °· You should check your breasts every month. If you get menstrual periods, the best time to check your breasts is 5-7 days after your menstrual period is over. °· Let your doctor know of any changes you see in your breasts, including changes in size, changes on the skin, pain or tenderness, or fluid from your nipples that is not normal. °This information is not intended to replace advice given to you by your health care provider. Make sure you discuss any questions you have with your health care provider. °Document Released: 03/22/2008 Document Revised: 05/23/2018 Document Reviewed: 05/23/2018 °Elsevier Patient Education © 2020 Elsevier Inc. ° °

## 2019-06-26 NOTE — Progress Notes (Signed)
Pt present for annual exam. Pt stated that she was doing well no problems.  

## 2019-06-27 ENCOUNTER — Other Ambulatory Visit: Payer: Self-pay

## 2019-06-27 ENCOUNTER — Ambulatory Visit (INDEPENDENT_AMBULATORY_CARE_PROVIDER_SITE_OTHER): Payer: 59 | Admitting: Obstetrics and Gynecology

## 2019-06-27 ENCOUNTER — Encounter: Payer: Self-pay | Admitting: Obstetrics and Gynecology

## 2019-06-27 ENCOUNTER — Other Ambulatory Visit (HOSPITAL_COMMUNITY)
Admission: RE | Admit: 2019-06-27 | Discharge: 2019-06-27 | Disposition: A | Discharge status: Home / Self Care | Payer: 59 | Source: Ambulatory Visit | Attending: Obstetrics and Gynecology | Admitting: Obstetrics and Gynecology

## 2019-06-27 VITALS — BP 114/79 | HR 65 | Ht 62.5 in | Wt 150.8 lb

## 2019-06-27 DIAGNOSIS — Z91018 Allergy to other foods: Secondary | ICD-10-CM

## 2019-06-27 DIAGNOSIS — Z124 Encounter for screening for malignant neoplasm of cervix: Secondary | ICD-10-CM | POA: Insufficient documentation

## 2019-06-27 DIAGNOSIS — F329 Major depressive disorder, single episode, unspecified: Secondary | ICD-10-CM

## 2019-06-27 DIAGNOSIS — F419 Anxiety disorder, unspecified: Secondary | ICD-10-CM

## 2019-06-27 DIAGNOSIS — Z13220 Encounter for screening for lipoid disorders: Secondary | ICD-10-CM | POA: Diagnosis not present

## 2019-06-27 DIAGNOSIS — Z975 Presence of (intrauterine) contraceptive device: Secondary | ICD-10-CM | POA: Diagnosis not present

## 2019-06-27 DIAGNOSIS — Z01419 Encounter for gynecological examination (general) (routine) without abnormal findings: Secondary | ICD-10-CM

## 2019-06-27 MED ORDER — CLONAZEPAM 1 MG PO TABS: 1.0000 mg | ORAL_TABLET | Freq: Two times a day (BID) | ORAL | 4 refills | Status: DC | PRN

## 2019-06-27 NOTE — Progress Notes (Signed)
GYNECOLOGY ANNUAL PHYSICAL EXAM PROGRESS NOTE  Subjective:    Jenny Delgado is a 28 y.o. [phone] female who presents for an annual exam. The patient has no complaints today. The patient is sexually active. The patient wears seatbelts: yes. The patient participates in regular exercise: no. Has the patient ever been transfused or tattooed?: no. The patient reports that there is not domestic violence in her life.   She does note that her stress levels are a little higher due to her children being at home with her during the day and she is trying to do home school with them as well as take her own classes as she is in school. Her husband works second shift. Needs a refill on her Klonopin.   Patient also desires to see what her levels are.  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 Has been compliant with avoiding certain foods but desires to know if her levels have improved.    Gynecologic History  Menarche age: 26 No LMP recorded. (Menstrual status: IUD). Contraception: IUD (in place x 3 years) History of STI's: Denies Last Pap: 06/24/2016. Results were: normal.  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    OB History  Gravida Para Term Preterm AB Living  6 2 2  0 4 2  SAB TAB Ectopic Multiple Live Births  3 0 0 0 2    # Outcome Date GA Lbr Len/2nd Weight Sex Delivery Anes PTL Lv  6 Term 05/11/16 [redacted]w[redacted]d  8 lb 9.9 oz (3.91 kg) F CS-LTranv Spinal  LIV     Name: Harn,GIRL Jelesa     Apgar1: 8  Apgar5: 9  5 SAB 05/2015        FD  4 Term 09/20/07 [redacted]w[redacted]d  7 lb 8 oz (3.402 kg) M CS-LTranv EPI  LIV     Name: Harrold Donath  3 AB           2 SAB           1 SAB             Past Medical History:  Diagnosis Date  . Anxiety   . Depression   . Headache(784.0)   . Internal hemorrhoids   . IUD contraception    mirena    Past Surgical History:  Procedure Laterality Date  . BREAST SURGERY    . CESAREAN SECTION  2008  . CESAREAN SECTION  N/A 05/11/2016   Procedure: REPEAT CESAREAN SECTION;  Surgeon: Hildred Laser, MD;  Location: [facility] ORS;  Service: Obstetrics;  Laterality: N/A;  . HEMORRHOID BANDING    . WISDOM TOOTH EXTRACTION      Family History  Problem Relation Age of Onset  . Hypertension Father   . Atrial fibrillation Father   . Diabetes Maternal Grandfather   . Hypertension Mother   . Breast cancer Maternal Grandmother 80  . Cervical cancer Neg Hx   . Colon cancer Neg Hx   . Ovarian cancer Neg Hx   . Heart disease Neg Hx     Social History   Socioeconomic History  . Marital status: Married    Spouse name: Not on file  . Number of children: Not on file  . Years of education: Not on file  . Highest education level: Not on file  Occupational History    Employer: [employer]  Social Needs  . Financial resource strain: Not on file  . Food insecurity  Worry: Not on file    Inability: Not on file  . Transportation needs    Medical: Not on file    Non-medical: Not on file  Tobacco Use  . Smoking status: Never Smoker  . Smokeless tobacco: Never Used  Substance and Sexual Activity  . Alcohol use: Yes    Comment: ocassional wine coolers   . Drug use: No  . Sexual activity: Yes    Birth control/protection: I.U.D.    Comment: Mirena 07/15/16  Lifestyle  . Physical activity    Days per week: Not on file    Minutes per session: Not on file  . Stress: Not on file  Relationships  . Social Herbalist on phone: Not on file    Gets together: Not on file    Attends religious service: Not on file    Active member of club or organization: Not on file    Attends meetings of clubs or organizations: Not on file    Relationship status: Not on file  . Intimate partner violence    Fear of current or ex partner: Not on file    Emotionally abused: Not on file    Physically abused: Not on file    Forced sexual activity: Not on file  Other Topics Concern  . 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 clonazePAM (KLONOPIN) 1 MG tablet Take 1 tablet (1 mg total) by mouth 2 (two) times daily as needed for anxiety. 30 tablet 4  . levonorgestrel (MIRENA) 20 MCG/24HR IUD 1 each by Intrauterine route once.    . polyethylene glycol powder (GLYCOLAX/MIRALAX) 17 GM/SCOOP powder Take 17 g by mouth daily.    . [DISCONTINUED] misoprostol (CYTOTEC) 200 MCG tablet Take 4 tablets (800 mcg total) by mouth once. (Patient not taking: Reported on 06/03/2015) 4 tablet 1   No current facility-administered medications on file prior to visit.     Allergies  Allergen Reactions  . Bactrim [Sulfamethoxazole-Trimethoprim] Hives, Rash and Other (See Comments)    Patient states she had a fever.  Loma Sousa [Escitalopram Oxalate] Other (See Comments)    Facial numbness  . Zoloft [Sertraline Hcl] Other (See Comments)    Facial numbness      Review of Systems Constitutional: negative for chills, fatigue, fevers and sweats Eyes: negative for irritation, redness and visual disturbance Ears, nose, mouth, throat, and face: negative for hearing loss, nasal congestion, snoring and tinnitus Respiratory: negative for asthma, cough, sputum Cardiovascular: negative for chest pain, dyspnea, exertional chest pressure/discomfort, irregular heart beat, palpitations and syncope Gastrointestinal: negative for abdominal pain, change in bowel habits, nausea and vomiting Genitourinary: negative for abnormal menstrual periods, genital lesions, sexual problems and vaginal discharge, dysuria and urinary incontinence Integument/breast: negative for breast lump, breast tenderness and nipple discharge  Hematologic/lymphatic: negative for bleeding and easy bruising Musculoskeletal:negative for back pain and muscle weakness Neurological: negative for dizziness, headaches, vertigo and weakness Endocrine: negative for diabetic symptoms including polydipsia, polyuria and skin dryness Allergic/Immunologic: negative for hay fever and urticaria       Objective:  Blood pressure 114/79, pulse 65, height 5' 2.5" (1.588  m), weight 150 lb 12.8 oz (68.4 kg). Body mass index is 27.14 kg/m.  General Appearance:    Alert, cooperative, no distress, appears stated age, overweight  Head:    Normocephalic, without obvious abnormality, atraumatic  Eyes:    PERRL, conjunctiva/corneas clear, EOM's intact, both eyes  Ears:    Normal external ear canals, both ears  Nose:   Nares normal, septum midline, mucosa normal, no drainage or sinus tenderness  Throat:   Lips, mucosa, and tongue normal; teeth and gums normal  Neck:   Supple, symmetrical, trachea midline, no adenopathy; thyroid: no enlargement/tenderness/nodules; no carotid bruit or JVD  Back:     Symmetric, no curvature, ROM normal, no CVA tenderness  Lungs:     Clear to auscultation bilaterally, respirations unlabored  Chest Wall:    No tenderness or deformity   Heart:    Regular rate and rhythm, S1 and S2 normal, no murmur, rub or gallop  Breast Exam:    No tenderness, masses, or nipple abnormality  Abdomen:     Soft, non-tender, bowel sounds active all four quadrants, no masses, no organomegaly.  Well-healed Pfannenstiel incision.   Genitalia:    Pelvic:external genitalia normal, vagina without lesions, discharge, or tenderness, rectovaginal septum  normal. Cervix normal in appearance, no cervical motion tenderness, no adnexal masses or tenderness.  Uterus normal size, shape, mobile, regular contours, nontender.  Rectal:    Normal external sphincter.  No hemorrhoids appreciated. Internal exam not done.   Extremities:   Extremities normal, atraumatic, no  cyanosis or edema  Pulses:   2+ and symmetric all extremities  Skin:   Skin color, texture, turgor normal, no rashes or lesions  Lymph nodes:   Cervical, supraclavicular, and axillary nodes normal  Neurologic:   CNII-XII intact, normal strength, sensation and reflexes throughout   .  Labs:  Lab Results  Component Value Date   WBC 4.1 06/21/2018   HGB 13.2 06/21/2018   HCT 38.1 06/21/2018   MCV 81 06/21/2018   PLT 177 06/21/2018    Lab Results  Component Value Date   CREATININE 0.69 06/21/2018   BUN 8 06/21/2018   NA 142 06/21/2018   K 3.9 06/21/2018   CL 104 06/21/2018   CO2 23 06/21/2018    Lab Results  Component Value Date   ALT 11 06/21/2018   AST 14 06/21/2018   ALKPHOS 58 06/21/2018   BILITOT 0.4 06/21/2018    No results found for: TSH    IgE 86   Alpha Galactose IgE 2.9 (H)  Comment: Previous reports (JACI [id number]) have demonstrated that patients with IgE antibodies to galactose-a-1,3-galactose are at risk for delayed anaphylaxis, angioedema, or urticaria following consumption of beef, pork, or lamb. *This test was developed and its performance characteristics determined by [facility]. It has not been cleared or approved by the [place].S. Food and Drug Administration. <0.35 kU/L   Tryptase  2.9  Comment: Test Performed by: [facility] [address] [place], [place], [place] [id number] Laboratory Director: Paul DykesWilliam G. Morice, II, M.D., Ph.D. <11.5 ng/mL  Specimen Collected on  Blood - Serum Unknown    Assessment:   Encounter for well woman exam with routine gynecological exam IUD (intrauterine device) in place Screening for lipid disorders Cervical cancer screening Lipid screening Allergy to alpha-gal Anxiety and depression  Plan:   Labs: CBC, CMP, and lipid panel (at patient's request) ordered.   Breast self exam technique reviewed and patient encouraged to perform self-exam  monthly.  Contraception: IUD. Due for removal in 2 years Discussed healthy lifestyle modifications. Pap smear performed today.  Desires refill on Klonopin due to increasing stressors and anxiety.  Refill given.  Allergy to alpha gal, patient desires to know if her levels have trended downward. Last checked in 2017.  Follow up in 1 year for annual exam.      Hildred Laserherry, Moriah Loughry, MD [facility]

## 2019-06-28 DIAGNOSIS — Z91018 Allergy to other foods: Secondary | ICD-10-CM | POA: Insufficient documentation

## 2019-06-29 LAB — CYTOLOGY - PAP
Diagnosis: NEGATIVE
HPV: NOT DETECTED

## 2019-07-03 LAB — ALPHA-GAL PANEL
Alpha Gal IgE*: 0.18 kU/L — ABNORMAL HIGH (ref <0.10)
Beef (Bos spp) IgE: <0.1 kU/L (ref <0.35)
Class Interpretation: 0
Class Interpretation: 0
Class Interpretation: 0
Lamb/Mutton (Ovis spp) IgE: <0.1 kU/L (ref <0.35)
Pork (Sus spp) IgE: <0.1 kU/L (ref <0.35)

## 2019-07-03 LAB — LIPID PANEL
Chol/HDL Ratio: 3.4 ratio (ref 0.0–4.4)
Cholesterol, Total: 130 mg/dL (ref 100–199)
HDL: 38 mg/dL — ABNORMAL LOW (ref >39)
LDL Chol Calc (NIH): 71 mg/dL (ref 0–99)
Triglycerides: 117 mg/dL (ref 0–149)
VLDL Cholesterol Cal: 21 mg/dL (ref 5–40)

## 2019-07-03 LAB — CBC
Hematocrit: 39 % (ref 34.0–46.6)
Hemoglobin: 13 g/dL (ref 11.1–15.9)
MCH: 27.8 pg (ref 26.6–33.0)
MCHC: 33.3 g/dL (ref 31.5–35.7)
MCV: 83 fL (ref 79–97)
Platelets: 180 10*3/uL (ref 150–450)
RBC: 4.68 x10E6/uL (ref 3.77–5.28)
RDW: 13.1 % (ref 11.7–15.4)
WBC: 5.4 10*3/uL (ref 3.4–10.8)

## 2019-07-03 LAB — COMPREHENSIVE METABOLIC PANEL
ALT: 14 IU/L (ref 0–32)
AST: 17 IU/L (ref 0–40)
Albumin/Globulin Ratio: 2.1 (ref 1.2–2.2)
Albumin: 4.5 g/dL (ref 3.9–5.0)
Alkaline Phosphatase: 54 IU/L (ref 39–117)
BUN/Creatinine Ratio: 14 (ref 9–23)
BUN: 10 mg/dL (ref 6–20)
Bilirubin Total: 0.4 mg/dL (ref 0.0–1.2)
CO2: 25 mmol/L (ref 20–29)
Calcium: 9.5 mg/dL (ref 8.7–10.2)
Chloride: 101 mmol/L (ref 96–106)
Creatinine, Ser: 0.72 mg/dL (ref 0.57–1.00)
GFR calc Af Amer: 130 mL/min/{1.73_m2} (ref >59)
GFR calc non Af Amer: 113 mL/min/{1.73_m2} (ref >59)
Globulin, Total: 2.1 g/dL (ref 1.5–4.5)
Glucose: 94 mg/dL (ref 65–99)
Potassium: 4.1 mmol/L (ref 3.5–5.2)
Sodium: 139 mmol/L (ref 134–144)
Total Protein: 6.6 g/dL (ref 6.0–8.5)

## 2019-07-03 LAB — TSH: TSH: 2.72 u[IU]/mL (ref 0.450–4.500)

## 2019-07-03 LAB — IGE: IgE (Immunoglobulin E), Serum: 25 IU/mL (ref 6–495)

## 2020-01-08 ENCOUNTER — Telehealth: Payer: Self-pay | Admitting: Internal Medicine

## 2020-01-08 NOTE — Telephone Encounter (Signed)
06/07/19 this patient had a hemorrhoid ligation, she is complaining of enlarged painful hemorrhoids, she has been scheduled with AE on 01/10/20 at 2:30 pm.

## 2020-01-10 ENCOUNTER — Ambulatory Visit: Payer: 59 | Admitting: Physician Assistant

## 2020-08-29 ENCOUNTER — Ambulatory Visit (INDEPENDENT_AMBULATORY_CARE_PROVIDER_SITE_OTHER): Payer: BC Managed Care – PPO | Admitting: Obstetrics and Gynecology

## 2020-08-29 ENCOUNTER — Encounter: Payer: Self-pay | Admitting: Obstetrics and Gynecology

## 2020-08-29 ENCOUNTER — Other Ambulatory Visit: Payer: Self-pay

## 2020-08-29 VITALS — BP 123/75 | HR 94 | Ht 62.5 in | Wt 139.6 lb

## 2020-08-29 DIAGNOSIS — F32A Depression, unspecified: Secondary | ICD-10-CM

## 2020-08-29 DIAGNOSIS — Z30432 Encounter for removal of intrauterine contraceptive device: Secondary | ICD-10-CM | POA: Diagnosis not present

## 2020-08-29 DIAGNOSIS — F419 Anxiety disorder, unspecified: Secondary | ICD-10-CM

## 2020-08-29 MED ORDER — CLONAZEPAM 1 MG PO TABS: 1.0000 mg | ORAL_TABLET | Freq: Two times a day (BID) | ORAL | 3 refills | Status: DC | PRN

## 2020-08-29 NOTE — Progress Notes (Signed)
Pt present for IUD removal. Pt stated that she wanted her IUD removed since her husband had a vasectomy a year ago.

## 2020-08-29 NOTE — Progress Notes (Signed)
    GYNECOLOGY OFFICE PROCEDURE NOTE  Jenny Delgado is a 29 y.o. [phone] here for Mirena IUD removal. IUD was inserted 06/2016.No GYN concerns.  Reports her husband had a vasectomy and no longer needs to use. Last pap smear was on 06/27/2019 and was normal.  Also desires refill of her Klonopin.  Only has to use "every now then".  Recent prescription expired.   IUD Removal  Patient identified, informed consent performed, consent signed.  Patient was in the dorsal lithotomy position, normal external genitalia was noted.  A speculum was placed in the patient's vagina, normal discharge was noted, no lesions. The cervix was visualized, no lesions, no abnormal discharge.  The strings of the IUD were grasped and pulled using ring forceps. The IUD was removed in its entirety.  Patient tolerated the procedure well.    Patient will use vasectomy for contraception. Routine preventative health maintenance measures emphasized. Refill given on medication.    Hildred Laser, MD [facility]

## 2021-11-20 ENCOUNTER — Encounter: Payer: BC Managed Care – PPO | Admitting: Obstetrics and Gynecology

## 2021-12-09 ENCOUNTER — Encounter: Payer: Self-pay | Admitting: Obstetrics and Gynecology

## 2021-12-09 ENCOUNTER — Other Ambulatory Visit: Payer: Self-pay

## 2021-12-09 ENCOUNTER — Ambulatory Visit: Payer: BC Managed Care – PPO | Admitting: Obstetrics and Gynecology

## 2021-12-09 VITALS — BP 109/56 | HR 76 | Resp 16 | Ht 63.0 in | Wt 139.0 lb

## 2021-12-09 DIAGNOSIS — Z8742 Personal history of other diseases of the female genital tract: Secondary | ICD-10-CM

## 2021-12-09 DIAGNOSIS — R102 Pelvic and perineal pain: Secondary | ICD-10-CM

## 2021-12-09 DIAGNOSIS — Z30430 Encounter for insertion of intrauterine contraceptive device: Secondary | ICD-10-CM

## 2021-12-09 NOTE — Patient Instructions (Signed)

## 2021-12-09 NOTE — Progress Notes (Signed)
GYNECOLOGY PROGRESS NOTE  Subjective:    Patient ID: Jenny Delgado, female    DOB: 31-Mar-1991, 31 y.o.   MRN: [id number]  HPI  Patient is a 31 y.o. [id number] female who presents for follow up left ovarian cyst.  She reports that in November she began experiencing middle abdominal pain, was evaluated by the emergency room in [place], [place] and was diagnosed with a possible ruptured ovarian cyst (due to findings of free fluid in the pelvis and no other explanation).  Patient reports that she was admitted for IV antibiotics as she also was noted to have appendiceal inflammation, was in the hospital for approximately 2 to 3 days.  Following this, in January she began to note lower pelvic pain, felt moderately painful.  She was seen by her PCP who ordered an ultrasound and then later during that week was seen in the ER as her pain became progressively worse and had an MRI.  Reports that she was told that she had a left ovarian cyst.  Patient most recently has begun to experience lower abdominal pain again last week.  Pain was intermittent, comes and goes.  Was also seen in the emergency room last week.  Of note patient reports that her menstrual cycle started on last Wednesday (12/03/2021), and that her pain began the following day.  She notes that she has never experienced this pain before, and wonders if the cysts in her pain are related to her menstrual cycle.  Patient previously had an IUD in place for several years and notes that she never had any issues with her periods or pain prior to this.  Also had never had an ovarian cyst before.  She wonders if she should return to the IUD to help with prevention of recurrence.  Currently using vasectomy for contraception.  The following portions of the patient's history were reviewed and updated as appropriate: allergies, current medications, past family history, past medical history, past social history, past surgical history, and problem list.  Review of  Systems Pertinent items noted in HPI and remainder of comprehensive ROS otherwise negative.   Objective:   Blood pressure (!) 109/56, pulse 76, resp. rate 16, height 5\' 3"  (1.6 m), weight 139 lb (63 kg), last menstrual period 12/03/2021.  Body mass index is 24.62 kg/m. General appearance: alert and no distress Abdomen: soft, non-tender; bowel sounds normal; no masses,  no organomegaly Pelvic: external genitalia normal, rectovaginal septum normal.  Vagina without discharge.  Cervix normal appearing, no lesions and no motion tenderness.  Uterus mobile, nontender, normal shape and size.  Adnexae non-palpable, mild tenderness on the left.  Extremities: extremities normal, atraumatic, no cyanosis or edema Neurologic: Grossly normal   Assessment:   1. Pelvic pain   2. History of ovarian cyst   3. Encounter for IUD insertion      Plan:   1. Pelvic pain -Pain likely related to cyst, however also cannot completely rule out the possibility of endometriosis based on manner in which her symptoms present.  Patient has been attempting to manage pain with ibuprofen as needed.  Patient thinks that she would like to have menstrual suppression with IUD to see if this will help resolve her pain and cyst recurrence.  We will insert today.  See insertion note below.  2. History of ovarian cyst -Unclear if this is still present at this time.  We will attempt to get records from [facility] in [place] [place].  Advised that if cyst continues to  cause pain or is of a certain size, could consider surgical management with removal.  Otherwise if cyst is noted to be small, will likely resolve with time.  3. Encounter for IUD insertion -Mirena IUD inserted today due to issues noted above.    IUD Insertion Procedure Note Patient identified, informed consent performed, consent signed.   Discussed risks of irregular bleeding, cramping, infection, malpositioning or misplacement of the IUD outside the uterus which may  require further procedure such as laparoscopy. Also discussed >99% contraception efficacy, increased risk of ectopic pregnancy with failure of method.   Emphasized that this did not protect against STIs, condoms recommended during all sexual encounters. Time out was performed.  Urine pregnancy test negative.  Speculum placed in the vagina.  Cervix visualized.  Cleaned with Betadine x 2.  Grasped anteriorly with a single tooth tenaculum.  Uterus sounded to 9 cm.  Mirena IUD placed per manufacturer's recommendations.  Strings trimmed to 3 cm. Tenaculum was removed, good hemostasis noted.  Patient tolerated procedure well.   Patient was given post-procedure instructions.   Patient was also asked to check IUD strings periodically and follow up in 4 weeks for IUD check.  Lot: [id number] Exp: 10/2023   Hildred Laser, MD [facility]

## 2021-12-22 ENCOUNTER — Encounter: Payer: Self-pay | Admitting: Obstetrics and Gynecology

## 2022-01-06 ENCOUNTER — Other Ambulatory Visit: Payer: Self-pay

## 2022-01-06 ENCOUNTER — Encounter: Payer: Self-pay | Admitting: Obstetrics and Gynecology

## 2022-01-06 ENCOUNTER — Ambulatory Visit (INDEPENDENT_AMBULATORY_CARE_PROVIDER_SITE_OTHER): Payer: BC Managed Care – PPO | Admitting: Obstetrics and Gynecology

## 2022-01-06 VITALS — BP 104/67 | HR 81 | Resp 16 | Ht 63.0 in | Wt 141.7 lb

## 2022-01-06 DIAGNOSIS — R102 Pelvic and perineal pain unspecified side: Secondary | ICD-10-CM

## 2022-01-06 DIAGNOSIS — R519 Headache, unspecified: Secondary | ICD-10-CM | POA: Diagnosis not present

## 2022-01-06 DIAGNOSIS — Z8742 Personal history of other diseases of the female genital tract: Secondary | ICD-10-CM | POA: Diagnosis not present

## 2022-01-06 DIAGNOSIS — Z30431 Encounter for routine checking of intrauterine contraceptive device: Secondary | ICD-10-CM | POA: Diagnosis not present

## 2022-01-06 MED ORDER — KETOROLAC TROMETHAMINE 10 MG PO TABS: 10.0000 mg | ORAL_TABLET | Freq: Four times a day (QID) | ORAL | 0 refills | Status: DC | PRN

## 2022-01-06 NOTE — Progress Notes (Signed)
? ? ?  GYNECOLOGY PROGRESS NOTE ? ?Subjective:  ? ? Patient ID: Jenny Delgado, female    DOB: 1990-11-24, 31 y.o.   MRN: [id number] ? ?HPI ? Patient is a 31 y.o. [id number] female who presents for Mirena IUD string check. She had IUD placed on 12/09/2021 for menstrual suppression.  Notes that she has been bleeding since her cycle started on last Thursday but is starting to lighten up.  Had significant dysmenorrhea and pelvic pain on her left side where her ovarian cyst is. Tylenol and heating pad not helpful.  ? ?The following portions of the patient's history were reviewed and updated as appropriate: allergies, current medications, past family history, past medical history, past social history, past surgical history, and problem list. ? ?Review of Systems ?A comprehensive review of systems was negative except for: Neurological: positive for headaches x 2 days.  No prior history of migraines  ? ?Objective:  ? Blood pressure 104/67, pulse 81, resp. rate 16, height 5\' 3"  (1.6 m), weight 141 lb 11.2 oz (64.3 kg).  Body mass index is 25.1 kg/m?. ?General appearance: alert and no distress ?Abdomen: soft, non-tender; bowel sounds normal; no masses,  no organomegaly ?Pelvic: external genitalia normal, rectovaginal septum normal.  Vagina with small amount of dark red blood. Cervix normal appearing, no lesions, IUD threads visible in external os, strings delivered with cytobrush.  Bimanual exam not performed.  ?Extremities: extremities normal, atraumatic, no cyanosis or edema ?Neurologic: Grossly normal ? ? ?Assessment:  ? ?1. IUD check up   ?2. Pelvic pain   ?3. History of ovarian cyst   ?4. Acute intractable headache, unspecified headache type   ?  ? ?Plan:  ? ?IUD - Appears within place.  Advised that she can continue use of the IUD for up to 8 years.  ?Pelvic pain - patient with left ovarian cyst, noted to be 3-4 cm on last scan.  Mostly dysmenorrhea associated with most recent period.  Was taking Ibuprofen but was  ineffective. Will prescribe Toradol for next cycle, can take as needed, and alternate with Tylenol.  ?Acute headache - prescribed Toradol for pelvic pain, can also utilize a dose for headache. Or can use Goody's BC powder or Excedrin.  ? ? ?Follow up if symptoms persist or fail to improve. Due for annual exam in September, to schedule.  ? ? ?[name], MD ?[facility]  ?

## 2022-05-25 NOTE — Progress Notes (Unsigned)
    GYNECOLOGY PROGRESS NOTE  Subjective:    Patient ID: Jenny Delgado, female    DOB: 05-Feb-1991, 31 y.o.   MRN: [id number]  HPI  Patient is a 31 y.o. [id number] female who presents for concerns about chronic abdominal pain. Pain has now been ongoing for over 6 months. The pain continues to occur several days before her cycle and lasts x 2-3 days. She reports that pain is severe. Pain is noted more in upper mid abdomen. Toradol is not working for her. She states that she can not work because the pain is so severe. She would like to repeat her ultrasound to see if there is any changes in her cyst. Last had outside CT scan in February noting left ovary with multiple cysts, largest 3.5 cm.   Of note, she reports that since having her IUD inserted in March, this is the first month that she has finally not had a cycle.   The following portions of the patient's history were reviewed and updated as appropriate: allergies, current medications, past family history, past medical history, past social history, past surgical history, and problem list.  Review of Systems Pertinent items are noted in HPI.   Objective:   Blood pressure (!) 103/59, pulse 76, resp. rate 16, height 5\' 3"  (1.6 m), weight 141 lb 11.2 oz (64.3 kg), last menstrual period 04/08/2022. Body mass index is 25.1 kg/m.  General appearance: alert, cooperative, and no distress Remainder of exam deferred   Assessment:   1. Pelvic pain   2. History of ovarian cyst      Plan:   Discussion with patient regarding follow up. Can repeat 04/10/2022 to reassess if cyst has resolved as she continues to have pain. Will change from Toradol to Tramadol for management of her pain those few days prior to her cycle. Reports most recent month was the first without a cycle with IUD in place, hopefully this will also help her chronic pain.  If cyst is still present, can consider short term use of combined OCPs to aid cyst resolution.  Concern that pain is  more in mid abdomen instead of pelvic. Atypical presentation, however is still associated with menses as pain occurs just before menses.     [place], MD [facility]

## 2022-05-26 ENCOUNTER — Ambulatory Visit (INDEPENDENT_AMBULATORY_CARE_PROVIDER_SITE_OTHER): Payer: BC Managed Care – PPO | Admitting: Obstetrics and Gynecology

## 2022-05-26 ENCOUNTER — Encounter: Payer: Self-pay | Admitting: Obstetrics and Gynecology

## 2022-05-26 VITALS — BP 103/59 | HR 76 | Resp 16 | Ht 63.0 in | Wt 141.7 lb

## 2022-05-26 DIAGNOSIS — R102 Pelvic and perineal pain: Secondary | ICD-10-CM

## 2022-05-26 DIAGNOSIS — Z8742 Personal history of other diseases of the female genital tract: Secondary | ICD-10-CM

## 2022-05-26 MED ORDER — TRAMADOL HCL 50 MG PO TABS: 50.0000 mg | ORAL_TABLET | Freq: Four times a day (QID) | ORAL | 0 refills | Status: DC | PRN

## 2022-05-31 ENCOUNTER — Ambulatory Visit (INDEPENDENT_AMBULATORY_CARE_PROVIDER_SITE_OTHER): Payer: BC Managed Care – PPO

## 2022-05-31 DIAGNOSIS — Z8742 Personal history of other diseases of the female genital tract: Secondary | ICD-10-CM

## 2022-05-31 DIAGNOSIS — R102 Pelvic and perineal pain: Secondary | ICD-10-CM

## 2022-06-02 ENCOUNTER — Encounter: Payer: Self-pay | Admitting: Obstetrics and Gynecology

## 2022-06-04 MED ORDER — NORETHIN ACE-ETH ESTRAD-FE 1.5-30 MG-MCG PO TABS: 1.0000 | ORAL_TABLET | Freq: Every day | ORAL | 0 refills | Status: DC

## 2022-06-04 NOTE — Telephone Encounter (Signed)
Patient calling to see if her meds have been called in.   CB# [phone]

## 2022-07-01 ENCOUNTER — Telehealth: Payer: Self-pay | Admitting: Obstetrics and Gynecology

## 2022-07-01 DIAGNOSIS — Z8742 Personal history of other diseases of the female genital tract: Secondary | ICD-10-CM

## 2022-07-01 NOTE — Telephone Encounter (Signed)
Patient is calling to scheduled an follow up ultrasound due to the birth control she is taking. There is no order. Please advise?

## 2022-07-06 NOTE — Telephone Encounter (Signed)
Please inform patient that ultrasound order has been placed, can schedule for next week.   Dr. Marcelline Mates

## 2022-07-09 ENCOUNTER — Encounter: Payer: BC Managed Care – PPO | Admitting: Obstetrics and Gynecology

## 2022-07-09 ENCOUNTER — Encounter: Payer: Self-pay | Admitting: Obstetrics and Gynecology

## 2022-07-09 ENCOUNTER — Other Ambulatory Visit (HOSPITAL_COMMUNITY)
Admission: RE | Admit: 2022-07-09 | Discharge: 2022-07-09 | Disposition: A | Discharge status: Home / Self Care | Payer: BC Managed Care – PPO | Source: Ambulatory Visit | Attending: Obstetrics and Gynecology | Admitting: Obstetrics and Gynecology

## 2022-07-09 ENCOUNTER — Ambulatory Visit (INDEPENDENT_AMBULATORY_CARE_PROVIDER_SITE_OTHER): Payer: BC Managed Care – PPO | Admitting: Obstetrics and Gynecology

## 2022-07-09 VITALS — BP 111/75 | HR 71 | Resp 16 | Ht 62.5 in | Wt 146.3 lb

## 2022-07-09 DIAGNOSIS — Z975 Presence of (intrauterine) contraceptive device: Secondary | ICD-10-CM | POA: Diagnosis not present

## 2022-07-09 DIAGNOSIS — Z124 Encounter for screening for malignant neoplasm of cervix: Secondary | ICD-10-CM | POA: Insufficient documentation

## 2022-07-09 DIAGNOSIS — Z8742 Personal history of other diseases of the female genital tract: Secondary | ICD-10-CM | POA: Diagnosis not present

## 2022-07-09 NOTE — Progress Notes (Signed)
   Subjective:     Jenny Delgado is a 31 y.o. woman who comes in today for a  pap smear only. Reports having annual exam done by her PCP.  Her most recent Pap smear was on 06/27/2019 and showed no abnormalities. Previous abnormal Pap smears: yes- 2014, LGSIL. Contraception: IUD  Of note, patient reports that she has an ultrasound scheduled for Monday to re-evaluate her ovarian cyst. Notes that she has been dealing with the cyst since November of last year. Has just completed her second round of OCPs to help the cyst resolves. Has questions about possible surgical removal if cyst is still present.   Lastly, continues to note upper abdominal pain (mostly left-sided but sometimes central).  Notes the intensity has decreased some, but the frequency has increased.  Has had negative GI workup including imaging in the past.   The following portions of the patient's history were reviewed and updated as appropriate: allergies, current medications, past family history, past medical history, past social history, past surgical history, and problem list.  Review of Systems Pertinent items are noted in HPI.   Objective:    BP 111/75   Pulse 71   Resp 16   Ht 5' 2.5" (1.588 m)   Wt 146 lb 4.8 oz (66.4 kg)   BMI 26.33 kg/m  Pelvic Exam: cervix normal in appearance, external genitalia normal, and vagina normal without discharge. IUD threads not visible on today's exam. Pap smear obtained.   Assessment:   Screening pap smear. Ovarian cyst  Plan:   - Continue routine q 3 years screens for pap smears, unless otherwise indicated.  - Follow up in 3 days for ultrasound. Discussed that if cyst remains persistent despite treatment, can consider surgical intervention with cystectomy. Can also do further exploration of her upper abdomen as patient continues to note pain but with negative workup.  Also can reassess for IUD positioning as threads not seen on today's exam (was noted to be in correct place on [place]  05/31/22).   A total of 22 minutes were spent during this encounter, including review of previous progress notes, recent imaging and labs, face-to-face with time with patient involving counseling and coordination of care, as well as documentation for current visit.    [name], MD [facility]

## 2022-07-12 ENCOUNTER — Ambulatory Visit (INDEPENDENT_AMBULATORY_CARE_PROVIDER_SITE_OTHER): Payer: BC Managed Care – PPO

## 2022-07-12 DIAGNOSIS — Z8742 Personal history of other diseases of the female genital tract: Secondary | ICD-10-CM

## 2022-07-14 ENCOUNTER — Encounter: Payer: Self-pay | Admitting: Obstetrics and Gynecology

## 2022-07-15 LAB — CYTOLOGY - PAP
Comment: NEGATIVE
Diagnosis: NEGATIVE
High risk HPV: NEGATIVE

## 2022-07-18 DIAGNOSIS — N83202 Unspecified ovarian cyst, left side: Secondary | ICD-10-CM

## 2022-07-18 HISTORY — DX: Unspecified ovarian cyst, left side: N83.202

## 2022-07-19 ENCOUNTER — Telehealth: Payer: Self-pay

## 2022-07-21 ENCOUNTER — Encounter: Payer: Self-pay | Admitting: Obstetrics and Gynecology

## 2022-07-21 ENCOUNTER — Telehealth (INDEPENDENT_AMBULATORY_CARE_PROVIDER_SITE_OTHER): Payer: BC Managed Care – PPO | Admitting: Obstetrics and Gynecology

## 2022-07-21 VITALS — Ht 62.5 in | Wt 142.0 lb

## 2022-07-21 DIAGNOSIS — N83202 Unspecified ovarian cyst, left side: Secondary | ICD-10-CM | POA: Diagnosis not present

## 2022-07-21 DIAGNOSIS — R102 Pelvic and perineal pain: Secondary | ICD-10-CM

## 2022-07-21 DIAGNOSIS — Z01818 Encounter for other preprocedural examination: Secondary | ICD-10-CM

## 2022-07-21 NOTE — Progress Notes (Signed)
   Virtual Visit via Video Note  I connected with Jenny Delgado on 07/21/22 at  1:30 PM EDT by a video enabled telemedicine application and verified that I am speaking with the correct person using two identifiers.  Location: Patient: Work Provider: Office   I discussed the limitations of evaluation and management by telemedicine and the availability of in person appointments. The patient expressed understanding and agreed to proceed.  History of Present Illness: Patient is a 31 y.o. [phone] female who presents for consultation about having a cystectomy to remove persistent ovarian cyst.  Patient has been treated with 2 courses of continuous OCPs (at different doses) and cyst has not changed much in size or nature.  She has had the cyst for almost 6 months.  Continues to note some intermittent pain on that side and would like to consider surgery.  Currently has Mirena IUD in place.   Observations/Objective: Height 5' 2.5" (1.588 m), weight 142 lb (64.4 kg), last menstrual period 07/16/2022. General App: alert, no acute distress Head: NCAT Neurologic: grossly normal.    Imaging:  US PELVIC COMPLETE WITH TRANSVAGINAL Patient Name: Jenny Delgado DOB: [birth year] MRN: [id number] LMP: 04/08/2022  ULTRASOUND REPORT  Location: [facility] Date of Service: 07/12/2022   Indications: f/u left ovarian cyst and endometrioma Findings:  The uterus is anteverted and measures 8.7 x 4.5 x 4.9 cm. Echo texture is homogenous without evidence of focal masses.  The Endometrium measures 5.0 mm. IUD is in the correct location with both arms extended.  Right Ovary measures 2.3 x 1.7 x 1.4 cm. It is normal in appearance. Left Ovary measures 5.8 x 3.4 x 4.3 cm. It is not normal in appearance. - cyst: 3.7-3.9cm -endometrioma: 17.7 x 20.4 x 13.26mm Survey of the adnexa demonstrates no adnexal masses. There is no free fluid in the cul de sac.  Impression: 1. IUD is correctly  located 2. Left ovarian cyst is stable in size 3. 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  Edwena Bunde, RDMS, RVT   I have reviewed this study and agree with documented findings.   [name], MD [facility]    Assessment and Plan: Patient with persistent left ovarian cyst, not responsive to medical management, intermittent pelvic pain.   I have previously discussed with patient that typically cyst of the size may still eventually resolve over time or may remain stable for many years.  Patient reports that due to persistent nature and intermittent pain, she would like to move forward with surgical intervention.  I reviewed risks and benefits of surgical intervention, including risk of infection, bleeding, need for further surgeries, and possible removal of entire ovary depending on the above of the ovary with the cyst.  Patient notes understanding.  Surgery within the next several weeks as her insurance deductible has been met and will restart at the beginning of November.  Will schedule for October 16th.   Follow Up Instructions:  I discussed the assessment and treatment plan with the patient. The patient was provided an opportunity to ask questions and all were answered. The patient agreed with the plan and demonstrated an understanding of the instructions.   The patient was advised to call back or seek an in-person evaluation if the symptoms worsen or if the condition fails to improve as anticipated.  I provided 9 minutes of non-face-to-face time during this encounter.   [name], MD

## 2022-07-21 NOTE — Patient Instructions (Signed)
GYNECOLOGY PRE-OPERATIVE INSTRUCTIONS  You are scheduled for surgery on 08/02/2022.  The name of your procedure is: Laparoscopic left ovarian cystectomy (removal of ovarian cyst).   Please read through these instructions carefully regarding preparation for your surgery: Nothing to eat after midnight on the day prior to surgery.  Do not take any medications unless recommended by your provider on day prior to surgery.  Do not take NSAIDs (Motrin, Aleve) or aspirin 7 days prior to surgery.  You may take Tylenol products for minor aches and pains.  You will receive a prescription for pain medications post-operatively.  You will be contacted by phone approximately 1-2 weeks prior to surgery to schedule your pre-operative appointment.  Please call the office if you have any questions regarding your upcoming surgery.    Thank you for choosing [facility].

## 2022-07-21 NOTE — H&P (View-Only) (Signed)
  GYNECOLOGY PREOPERATIVE HISTORY AND PHYSICAL   Subjective:  Jenny Delgado is a 31 y.o. G6P2042 here for surgical management of persistent left ovarian cyst, intermittent pelvic pain. She has tried and failed medical management with OCPs. No significant preoperative concerns.  Proposed surgery: Laparoscopic Left Ovarian Cystectomy   Pertinent Gynecological History: Contraception: IUD Last pap: normal Date: 07/09/2022   Past Medical History:  Diagnosis Date   Anxiety    Depression    Headache(784.0)    Internal hemorrhoids    IUD contraception    mirena    Past Surgical History:  Procedure Laterality Date   BREAST SURGERY     CESAREAN SECTION  2008   CESAREAN SECTION N/A 05/11/2016   Procedure: REPEAT CESAREAN SECTION;  Surgeon: Jenny Tep, MD;  Location: [facility] ORS;  Service: Obstetrics;  Laterality: N/A;   HEMORRHOID BANDING     WISDOM TOOTH EXTRACTION      OB History  Gravida Para Term Preterm AB Living  6 2 2   4 2  SAB IAB Ectopic Multiple Live Births  3     0 2    # Outcome Date GA Lbr Len/2nd Weight Sex Delivery Anes PTL Lv  6 Term 05/11/16 [redacted]w[redacted]d  8 lb 9.9 oz (3.91 kg) F CS-LTranv Spinal  LIV  5 SAB 05/2015        FD  4 Term 09/20/07 [redacted]w[redacted]d  7 lb 8 oz (3.402 kg) M CS-LTranv EPI  LIV  3 AB           2 SAB           1 SAB             Family History  Problem Relation Age of Onset   Hypertension Father    Atrial fibrillation Father    Diabetes Maternal Grandfather    Hypertension Mother    Breast cancer Maternal Grandmother 80   Cervical cancer Neg Hx    Colon cancer Neg Hx    Ovarian cancer Neg Hx    Heart disease Neg Hx     Social History   Socioeconomic History   Marital status: Married    Spouse name: Not on file   Number of children: Not on file   Years of education: Not on file   Highest education level: Not on file  Occupational History    Employer: [employer]  Tobacco Use   Smoking status: Never   Smokeless tobacco:  Never  Vaping Use   Vaping Use: Never used  Substance and Sexual Activity   Alcohol use: Yes    Comment: ocassional wine coolers    Drug use: No   Sexual activity: Yes    Birth control/protection: Other-see comments    Comment: husband vastcomy  Other Topics Concern   Not on file  Social History Narrative   Married 1 son (2009) 1 daughter 2017   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 20 tablet 0   levonorgestrel (MIRENA) 20 MCG/DAY IUD 1 each by Intrauterine route once.     lisdexamfetamine (VYVANSE) 20 MG capsule      traMADol (ULTRAM) 50 MG tablet Take 1-2 tablets (50-100 mg total) by mouth every 6 (six) hours as needed for moderate pain or severe pain. 30 tablet 0   [DISCONTINUED] misoprostol (CYTOTEC) 200 MCG tablet Take 4 tablets (800 mcg total) by mouth once. (Patient not taking: Reported on 06/03/2015) 4 tablet 1   No current facility-administered medications on file prior to visit.   Allergies  Allergen Reactions   Bactrim [Sulfamethoxazole-Trimethoprim] Hives, Rash and Other (See Comments)    Patient states she had a fever.   Lexapro [Escitalopram Oxalate] Other (See Comments)    Facial numbness   Zoloft [Sertraline Hcl] Other (See Comments)    Facial numbness      Review of Systems Constitutional: No recent fever/chills/sweats Respiratory: No recent  cough/bronchitis Cardiovascular: No chest pain Gastrointestinal: No recent nausea/vomiting/diarrhea Genitourinary: No UTI symptoms Hematologic/lymphatic:No history of coagulopathy or recent blood thinner use    Objective:   Height 5' 2.5" (1.588 m), weight 142 lb (64.4 kg), last menstrual period 07/16/2022. CONSTITUTIONAL: Well-developed, well-nourished female in no acute distress.  HENT:  Normocephalic, atraumatic, External right and left ear normal. Oropharynx is clear and moist EYES: Conjunctivae and EOM are normal. Pupils are equal, round, and reactive to light. No scleral icterus.  NECK: Normal range of motion, supple, no masses SKIN: Skin is warm and dry. No rash noted. Not diaphoretic. No erythema. No pallor. NEUROLOGIC: Alert and oriented to person, place, and time. Normal reflexes, muscle tone coordination. No cranial nerve deficit noted. PSYCHIATRIC: Normal mood and affect. Normal behavior. Normal judgment and thought content. CARDIOVASCULAR: Normal heart rate noted, regular rhythm RESPIRATORY: Effort and breath sounds normal, no problems with respiration noted ABDOMEN: Soft, nontender, nondistended. PELVIC: Deferred MUSCULOSKELETAL: Normal range of motion. No edema and no tenderness. 2+ distal pulses.    Labs:  Lab Results  Component Value Date   WBC 5.4 06/27/2019   HGB 13.0 06/27/2019   HCT 39.0 06/27/2019   MCV 83 06/27/2019   PLT 180 06/27/2019     Imaging Studies: US PELVIC COMPLETE WITH TRANSVAGINAL  Result Date: 07/14/2022 Patient Name: Jenny Delgado DOB: 09/14/1991 MRN: [id number] LMP: 04/08/2022 ULTRASOUND REPORT Location: [facility] Date of Service: 07/12/2022 Indications: f/u left ovarian cyst and endometrioma Findings: The uterus is anteverted and measures 8.7 x 4.5 x 4.9 cm. Echo texture is homogenous without evidence of focal masses. The Endometrium measures 5.0 mm. IUD is in the correct location with both arms extended. Right Ovary  measures 2.3 x 1.7 x 1.4 cm. It is normal in appearance. Left Ovary measures 5.8 x 3.4 x 4.3 cm. It is not normal in appearance. - cyst: 3.7-3.9cm -endometrioma: 17.7 x 20.4 x 13.3mm Survey of the adnexa demonstrates no adnexal masses. There is no free fluid in the cul de sac. Impression: 1. IUD is correctly located 2. Left ovarian cyst is stable in size 3. 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 Kristen Priestley, RDMS, RVT I have reviewed this study and agree with documented findings. Araly Kaas, MD [facility]    Assessment:    Persistent left ovarian cyst Pelvic pain    Plan:      Counseling: Procedure, risks, reasons, benefits and complications (including injury to bowel, bladder, major blood vessel, ureter, bleeding, possibility of transfusion, infection, or fistula formation) reviewed in detail. Likelihood of success in alleviating the patient's condition was discussed. Routine postoperative instructions will be reviewed with the patient and her family in detail after surgery.  The patient concurred with the proposed plan, giving informed written consent for the surgery.   Preop testing ordered. Instructions reviewed, including NPO after midnight.     [name], MD [facility]

## 2022-07-21 NOTE — H&P (Signed)
GYNECOLOGY PREOPERATIVE HISTORY AND PHYSICAL   Subjective:  Jenny Delgado is a 31 y.o. [id number] here for surgical management of persistent left ovarian cyst, intermittent pelvic pain. She has tried and failed medical management with OCPs. No significant preoperative concerns.  Proposed surgery: Laparoscopic Left Ovarian Cystectomy   Pertinent Gynecological History: Contraception: IUD Last pap: normal Date: 07/09/2022   Past Medical History:  Diagnosis Date   Anxiety    Depression    Headache(784.0)    Internal hemorrhoids    IUD contraception    mirena    Past Surgical History:  Procedure Laterality Date   BREAST SURGERY     CESAREAN SECTION  2008   CESAREAN SECTION N/A 05/11/2016   Procedure: REPEAT CESAREAN SECTION;  Surgeon: [name], MD;  Location: [facility] ORS;  Service: Obstetrics;  Laterality: N/A;   HEMORRHOID BANDING     WISDOM TOOTH EXTRACTION      OB History  Gravida Para Term Preterm AB Living  6 2 2   4 2   SAB IAB Ectopic Multiple Live Births  3     0 2    # Outcome Date GA Lbr Len/2nd Weight Sex Delivery Anes PTL Lv  6 Term 05/11/16 [redacted]w[redacted]d  8 lb 9.9 oz (3.91 kg) F CS-LTranv Spinal  LIV  5 SAB 05/2015        FD  4 Term 09/20/07 [redacted]w[redacted]d  7 lb 8 oz (3.402 kg) M CS-LTranv EPI  LIV  3 AB           2 SAB           1 SAB             Family History  Problem Relation Age of Onset   Hypertension Father    Atrial fibrillation Father    Diabetes Maternal Grandfather    Hypertension Mother    Breast cancer Maternal Grandmother 80   Cervical cancer Neg Hx    Colon cancer Neg Hx    Ovarian cancer Neg Hx    Heart disease Neg Hx     Social History   Socioeconomic History   Marital status: Married    Spouse name: Not on file   Number of children: Not on file   Years of education: Not on file   Highest education level: Not on file  Occupational History    Employer: [employer]  Tobacco Use   Smoking status: Never   Smokeless tobacco:  Never  Vaping Use   Vaping Use: Never used  Substance and Sexual Activity   Alcohol use: Yes    Comment: ocassional wine coolers    Drug use: No   Sexual activity: Yes    Birth control/protection: Other-see comments    Comment: husband vastcomy  Other Topics Concern   Not on file  Social History Narrative   Married 1 son (2009) 1 daughter 2017   Therapist, 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, learning disability   previously taught health careers in [employer]   Never smoker, no drugs rare - occasional alcohol   Caffeine 2-3/day   Social Determinants of Radio broadcast assistant Strain: Not on file  Food Insecurity: Not on file  Transportation Needs: Not on file  Physical Activity: Not on file  Stress: Not on file  Social Connections: Not on file  Intimate Partner Violence: Not on file    Current Outpatient Medications on File Prior to Visit  Medication Sig Dispense  Refill   ketorolac (TORADOL) 10 MG tablet Take 1 tablet (10 mg total) by mouth every 6 (six) hours as needed. 20 tablet 0   levonorgestrel (MIRENA) 20 MCG/DAY IUD 1 each by Intrauterine route once.     lisdexamfetamine (VYVANSE) 20 MG capsule      traMADol (ULTRAM) 50 MG tablet Take 1-2 tablets (50-100 mg total) by mouth every 6 (six) hours as needed for moderate pain or severe pain. 30 tablet 0   [DISCONTINUED] misoprostol (CYTOTEC) 200 MCG tablet Take 4 tablets (800 mcg total) by mouth once. (Patient not taking: Reported on 06/03/2015) 4 tablet 1   No current facility-administered medications on file prior to visit.   Allergies  Allergen Reactions   Bactrim [Sulfamethoxazole-Trimethoprim] Hives, Rash and Other (See Comments)    Patient states she had a fever.   Lexapro [Escitalopram Oxalate] Other (See Comments)    Facial numbness   Zoloft [Sertraline Hcl] Other (See Comments)    Facial numbness      Review of Systems Constitutional: No recent fever/chills/sweats Respiratory: No recent  cough/bronchitis Cardiovascular: No chest pain Gastrointestinal: No recent nausea/vomiting/diarrhea Genitourinary: No UTI symptoms Hematologic/lymphatic:No history of coagulopathy or recent blood thinner use    Objective:   Height 5' 2.5" (1.588 m), weight 142 lb (64.4 kg), last menstrual period 07/16/2022. CONSTITUTIONAL: Well-developed, well-nourished female in no acute distress.  HENT:  Normocephalic, atraumatic, External right and left ear normal. Oropharynx is clear and moist EYES: Conjunctivae and EOM are normal. Pupils are equal, round, and reactive to light. No scleral icterus.  NECK: Normal range of motion, supple, no masses SKIN: Skin is warm and dry. No rash noted. Not diaphoretic. No erythema. No pallor. NEUROLOGIC: Alert and oriented to person, place, and time. Normal reflexes, muscle tone coordination. No cranial nerve deficit noted. PSYCHIATRIC: Normal mood and affect. Normal behavior. Normal judgment and thought content. CARDIOVASCULAR: Normal heart rate noted, regular rhythm RESPIRATORY: Effort and breath sounds normal, no problems with respiration noted ABDOMEN: Soft, nontender, nondistended. PELVIC: Deferred MUSCULOSKELETAL: Normal range of motion. No edema and no tenderness. 2+ distal pulses.    Labs:  Lab Results  Component Value Date   WBC 5.4 06/27/2019   HGB 13.0 06/27/2019   HCT 39.0 06/27/2019   MCV 83 06/27/2019   PLT 180 06/27/2019     Imaging Studies: US PELVIC COMPLETE WITH TRANSVAGINAL  Result Date: 07/14/2022 Patient Name: Gae Hasenauer DOB: Oct 16, 1991 MRN: [id number] LMP: 04/08/2022 ULTRASOUND REPORT Location: [facility] Date of Service: 07/12/2022 Indications: f/u left ovarian cyst and endometrioma Findings: The uterus is anteverted and measures 8.7 x 4.5 x 4.9 cm. Echo texture is homogenous without evidence of focal masses. The Endometrium measures 5.0 mm. IUD is in the correct location with both arms extended. Right Ovary  measures 2.3 x 1.7 x 1.4 cm. It is normal in appearance. Left Ovary measures 5.8 x 3.4 x 4.3 cm. It is not normal in appearance. - cyst: 3.7-3.9cm -endometrioma: 17.7 x 20.4 x 13.52mm Survey of the adnexa demonstrates no adnexal masses. There is no free fluid in the cul de sac. Impression: 1. IUD is correctly located 2. Left ovarian cyst is stable in size 3. 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 Edwena Bunde, RDMS, RVT I have reviewed this study and agree with documented findings. [name], MD [facility]    Assessment:    Persistent left ovarian cyst Pelvic pain    Plan:  Counseling: Procedure, risks, reasons, benefits and complications (including injury to bowel, bladder, major blood vessel, ureter, bleeding, possibility of transfusion, infection, or fistula formation) reviewed in detail. Likelihood of success in alleviating the patient's condition was discussed. Routine postoperative instructions will be reviewed with the patient and her family in detail after surgery.  The patient concurred with the proposed plan, giving informed written consent for the surgery.   Preop testing ordered. Instructions reviewed, including NPO after midnight.     [name], MD [facility]

## 2022-07-28 ENCOUNTER — Encounter
Admission: RE | Admit: 2022-07-28 | Discharge: 2022-07-28 | Disposition: A | Discharge status: Home / Self Care | Payer: BC Managed Care – PPO | Source: Ambulatory Visit | Attending: Obstetrics and Gynecology | Admitting: Obstetrics and Gynecology

## 2022-07-28 VITALS — Ht 62.5 in | Wt 142.0 lb

## 2022-07-28 DIAGNOSIS — Z01812 Encounter for preprocedural laboratory examination: Secondary | ICD-10-CM

## 2022-07-28 HISTORY — DX: Other specified postprocedural states: R11.2

## 2022-07-28 HISTORY — DX: Nausea with vomiting, unspecified: Z98.890

## 2022-07-28 NOTE — Patient Instructions (Signed)
Your procedure is scheduled on: Monday, October 16 Report to the Registration Desk on the 1st floor of the [facility]. To find out your arrival time, please call [phone] between 1PM - 3PM on: Friday, October 13 If your arrival time is 6:00 am, do not arrive prior to that time as the [facility] entrance doors do not open until 6:00 am.  REMEMBER: Instructions that are not followed completely may result in serious medical risk, up to and including death; or upon the discretion of your surgeon and anesthesiologist your surgery may need to be rescheduled.  Do not eat or drink after midnight the night before surgery.  No gum chewing, lozengers or hard candies.  TAKE THESE MEDICATIONS THE MORNING OF SURGERY WITH A SIP OF WATER:  Tramadol if needed for pain  One week prior to surgery: starting today, October 11 Stop Anti-inflammatories (NSAIDS) such as Advil, Aleve, Ibuprofen, Motrin, Naproxen, Naprosyn and Aspirin based products such as Excedrin, Goodys Powder, BC Powder. Stop ANY OVER THE COUNTER supplements until after surgery. You may however, continue to take Tylenol if needed for pain up until the day of surgery.  No Alcohol for 24 hours before or after surgery.  No Smoking including e-cigarettes for 24 hours prior to surgery.  No chewable tobacco products for at least 6 hours prior to surgery.  No nicotine patches on the day of surgery.  Do not use any "recreational" drugs for at least a week prior to your surgery.  Please be advised that the combination of cocaine and anesthesia may have negative outcomes, up to and including death. If you test positive for cocaine, your surgery will be cancelled.  On the morning of surgery brush your teeth with toothpaste and water, you may rinse your mouth with mouthwash if you wish. Do not swallow any toothpaste or mouthwash.  Use CHG Soap as directed on instruction sheet.  Do not wear jewelry, make-up, hairpins, clips or nail  polish.  Do not wear lotions, powders, or perfumes.   Do not shave body from the neck down 48 hours prior to surgery just in case you cut yourself which could leave a site for infection.  Also, freshly shaved skin may become irritated if using the CHG soap.  Contact lenses, hearing aids and dentures may not be worn into surgery.  Do not bring valuables to the hospital. [facility] is not responsible for any missing/lost belongings or valuables.   Notify your doctor if there is any change in your medical condition (cold, fever, infection).  Wear comfortable clothing (specific to your surgery type) to the hospital.  After surgery, you can help prevent lung complications by doing breathing exercises.  Take deep breaths and cough every 1-2 hours. Your doctor may order a device called an Incentive Spirometer to help you take deep breaths. When coughing or sneezing, hold a pillow firmly against your incision with both hands. This is called "splinting." Doing this helps protect your incision. It also decreases belly discomfort.  If you are being discharged the day of surgery, you will not be allowed to drive home. You will need a responsible adult (18 years or older) to drive you home and stay with you that night.   If you are taking public transportation, you will need to have a responsible adult (18 years or older) with you. Please confirm with your physician that it is acceptable to use public transportation.   Please call the [facility] Dept. at [phone] if  you have any questions about these instructions.  Surgery Visitation Policy:  Patients undergoing a surgery or procedure may have two family members or support persons with them as long as the person is not COVID-19 positive or experiencing its symptoms.      Preparing for Surgery with CHLORHEXIDINE GLUCONATE (CHG) Soap  Chlorhexidine Gluconate (CHG) Soap  o An antiseptic cleaner that kills germs and bonds with  the skin to continue killing germs even after washing  o Used for showering the night before surgery and morning of surgery  Before surgery, you can play an important role by reducing the number of germs on your skin.  CHG (Chlorhexidine gluconate) soap is an antiseptic cleanser which kills germs and bonds with the skin to continue killing germs even after washing.  Please do not use if you have an allergy to CHG or antibacterial soaps. If your skin becomes reddened/irritated stop using the CHG.  1. Shower the NIGHT BEFORE SURGERY and the MORNING OF SURGERY with CHG soap.  2. If you choose to wash your hair, wash your hair first as usual with your normal shampoo.  3. After shampooing, rinse your hair and body thoroughly to remove the shampoo.  4. Use CHG as you would any other liquid soap. You can apply CHG directly to the skin and wash gently with a scrungie or a clean washcloth.  5. Apply the CHG soap to your body only from the neck down. Do not use on open wounds or open sores. Avoid contact with your eyes, ears, mouth, and genitals (private parts). Wash face and genitals (private parts) with your normal soap.  6. Wash thoroughly, paying special attention to the area where your surgery will be performed.  7. Thoroughly rinse your body with warm water.  8. Do not shower/wash with your normal soap after using and rinsing off the CHG soap.  9. Pat yourself dry with a clean towel.  10. Wear clean pajamas to bed the night before surgery.  12. Place clean sheets on your bed the night of your first shower and do not sleep with pets.  13. Shower again with the CHG soap on the day of surgery prior to arriving at the hospital.  14. Do not apply any deodorants/lotions/powders.  15. Please wear clean clothes to the hospital.

## 2022-07-29 ENCOUNTER — Encounter: Payer: Self-pay | Admitting: Urgent Care

## 2022-07-29 ENCOUNTER — Encounter
Admission: RE | Admit: 2022-07-29 | Discharge: 2022-07-29 | Disposition: A | Discharge status: Home / Self Care | Payer: BC Managed Care – PPO | Source: Ambulatory Visit | Attending: Obstetrics and Gynecology | Admitting: Obstetrics and Gynecology

## 2022-07-29 DIAGNOSIS — Z01818 Encounter for other preprocedural examination: Secondary | ICD-10-CM | POA: Diagnosis present

## 2022-07-29 DIAGNOSIS — Z01812 Encounter for preprocedural laboratory examination: Secondary | ICD-10-CM | POA: Diagnosis not present

## 2022-07-29 LAB — CBC
HCT: 41.9 % (ref 36.0–46.0)
Hemoglobin: 14 g/dL (ref 12.0–15.0)
MCH: 27.6 pg (ref 26.0–34.0)
MCHC: 33.4 g/dL (ref 30.0–36.0)
MCV: 82.6 fL (ref 80.0–100.0)
Platelets: 228 10*3/uL (ref 150–400)
RBC: 5.07 MIL/uL (ref 3.87–5.11)
RDW: 12.1 % (ref 11.5–15.5)
WBC: 6.5 10*3/uL (ref 4.0–10.5)
nRBC: 0 % (ref 0.0–0.2)

## 2022-07-29 LAB — TYPE AND SCREEN
ABO/RH(D): A POS
Antibody Screen: NEGATIVE

## 2022-07-31 ENCOUNTER — Encounter: Payer: Self-pay | Admitting: Obstetrics and Gynecology

## 2022-08-02 ENCOUNTER — Ambulatory Visit
Admission: RE | Admit: 2022-08-02 | Payer: BC Managed Care – PPO | Source: Home / Self Care | Admitting: Obstetrics and Gynecology

## 2022-08-02 ENCOUNTER — Encounter: Admission: RE | Payer: Self-pay | Source: Home / Self Care

## 2022-08-02 SURGERY — EXCISION, CYST, OVARY, LAPAROSCOPIC
Anesthesia: General | Laterality: Left

## 2022-08-12 ENCOUNTER — Encounter
Admission: RE | Admit: 2022-08-12 | Discharge: 2022-08-12 | Disposition: A | Discharge status: Home / Self Care | Payer: BC Managed Care – PPO | Source: Ambulatory Visit | Attending: Obstetrics and Gynecology | Admitting: Obstetrics and Gynecology

## 2022-08-12 DIAGNOSIS — Z01812 Encounter for preprocedural laboratory examination: Secondary | ICD-10-CM

## 2022-08-12 NOTE — Pre-Procedure Instructions (Signed)
PAT follow up due to surgery being cancelled, she has completed her antibiotic for strep throat and is symptom free, she has no changes in medication and has no questions at this time.

## 2022-08-16 ENCOUNTER — Encounter
Admission: RE | Disposition: A | Discharge status: Home / Self Care | Payer: Self-pay | Source: Home / Self Care | Attending: Obstetrics and Gynecology

## 2022-08-16 ENCOUNTER — Ambulatory Visit
Admission: RE | Admit: 2022-08-16 | Discharge: 2022-08-16 | Disposition: A | Discharge status: Home / Self Care | Payer: BC Managed Care – PPO | Attending: Obstetrics and Gynecology | Admitting: Obstetrics and Gynecology

## 2022-08-16 ENCOUNTER — Other Ambulatory Visit: Payer: Self-pay

## 2022-08-16 ENCOUNTER — Encounter: Payer: Self-pay | Admitting: Obstetrics and Gynecology

## 2022-08-16 ENCOUNTER — Ambulatory Visit: Payer: BC Managed Care – PPO | Admitting: Anesthesiology

## 2022-08-16 DIAGNOSIS — N83202 Unspecified ovarian cyst, left side: Secondary | ICD-10-CM | POA: Diagnosis not present

## 2022-08-16 DIAGNOSIS — N83201 Unspecified ovarian cyst, right side: Secondary | ICD-10-CM

## 2022-08-16 DIAGNOSIS — Z01812 Encounter for preprocedural laboratory examination: Secondary | ICD-10-CM

## 2022-08-16 DIAGNOSIS — Z9889 Other specified postprocedural states: Secondary | ICD-10-CM

## 2022-08-16 DIAGNOSIS — N809 Endometriosis, unspecified: Secondary | ICD-10-CM

## 2022-08-16 DIAGNOSIS — Z975 Presence of (intrauterine) contraceptive device: Secondary | ICD-10-CM | POA: Insufficient documentation

## 2022-08-16 DIAGNOSIS — N80329 Endometriosis of the posterior cul-de-sac, unspecified depth: Secondary | ICD-10-CM | POA: Diagnosis not present

## 2022-08-16 DIAGNOSIS — Z98891 History of uterine scar from previous surgery: Secondary | ICD-10-CM | POA: Diagnosis not present

## 2022-08-16 DIAGNOSIS — Z8719 Personal history of other diseases of the digestive system: Secondary | ICD-10-CM | POA: Diagnosis not present

## 2022-08-16 DIAGNOSIS — N83292 Other ovarian cyst, left side: Secondary | ICD-10-CM | POA: Insufficient documentation

## 2022-08-16 DIAGNOSIS — Z01818 Encounter for other preprocedural examination: Secondary | ICD-10-CM

## 2022-08-16 DIAGNOSIS — R102 Pelvic and perineal pain: Secondary | ICD-10-CM | POA: Diagnosis present

## 2022-08-16 HISTORY — PX: LAPAROSCOPIC OVARIAN CYSTECTOMY: SHX6248

## 2022-08-16 LAB — TYPE AND SCREEN
ABO/RH(D): A POS
Antibody Screen: NEGATIVE

## 2022-08-16 LAB — POCT PREGNANCY, URINE: Preg Test, Ur: NEGATIVE

## 2022-08-16 SURGERY — EXCISION, CYST, OVARY, LAPAROSCOPIC
Anesthesia: General | Laterality: Left

## 2022-08-16 MED ORDER — MIDAZOLAM HCL 2 MG/2ML IJ SOLN
INTRAMUSCULAR | Status: DC | PRN
  Administered 2022-08-16: 2 mg via INTRAVENOUS

## 2022-08-16 MED ORDER — GLYCOPYRROLATE 0.2 MG/ML IJ SOLN
INTRAMUSCULAR | Status: DC | PRN
  Administered 2022-08-16: .2 mg via INTRAVENOUS

## 2022-08-16 MED ORDER — PROPOFOL 10 MG/ML IV BOLUS
INTRAVENOUS | Status: DC | PRN
  Administered 2022-08-16: 140 mg via INTRAVENOUS
  Administered 2022-08-16: 40 mg via INTRAVENOUS

## 2022-08-16 MED ORDER — EPHEDRINE 5 MG/ML INJ
INTRAVENOUS | Status: AC
  Filled 2022-08-16: qty 5

## 2022-08-16 MED ORDER — DIPHENHYDRAMINE HCL 50 MG/ML IJ SOLN
INTRAMUSCULAR | Status: AC
  Filled 2022-08-16: qty 1

## 2022-08-16 MED ORDER — 0.9 % SODIUM CHLORIDE (POUR BTL) OPTIME
TOPICAL | Status: DC | PRN
  Administered 2022-08-16: 50 mL

## 2022-08-16 MED ORDER — ONDANSETRON HCL 4 MG/2ML IJ SOLN
INTRAMUSCULAR | Status: DC | PRN
  Administered 2022-08-16: 4 mg via INTRAVENOUS

## 2022-08-16 MED ORDER — FAMOTIDINE 20 MG PO TABS: 20.0000 mg | ORAL_TABLET | Freq: Once | ORAL | Status: AC

## 2022-08-16 MED ORDER — ATROPINE SULFATE 0.4 MG/ML IV SOLN
INTRAVENOUS | Status: AC
  Filled 2022-08-16: qty 1

## 2022-08-16 MED ORDER — MIDAZOLAM HCL 2 MG/2ML IJ SOLN
INTRAMUSCULAR | Status: AC
  Filled 2022-08-16: qty 2

## 2022-08-16 MED ORDER — GABAPENTIN 300 MG PO CAPS
ORAL_CAPSULE | ORAL | Status: AC
  Administered 2022-08-16: 300 mg via ORAL
  Filled 2022-08-16: qty 1

## 2022-08-16 MED ORDER — CHLORHEXIDINE GLUCONATE 0.12 % MT SOLN: 15.0000 mL | Freq: Once | OROMUCOSAL | Status: AC

## 2022-08-16 MED ORDER — ONDANSETRON 4 MG PO TBDP: 4.0000 mg | ORAL_TABLET | Freq: Four times a day (QID) | ORAL | 0 refills | Status: DC | PRN

## 2022-08-16 MED ORDER — LACTATED RINGERS IV SOLN: INTRAVENOUS | Status: DC

## 2022-08-16 MED ORDER — ACETAMINOPHEN 500 MG PO TABS: 1000.0000 mg | ORAL_TABLET | ORAL | Status: AC

## 2022-08-16 MED ORDER — OXYCODONE-ACETAMINOPHEN 5-325 MG PO TABS: 1.0000 | ORAL_TABLET | Freq: Four times a day (QID) | ORAL | 0 refills | Status: DC | PRN

## 2022-08-16 MED ORDER — GLYCOPYRROLATE 0.2 MG/ML IJ SOLN
INTRAMUSCULAR | Status: AC
  Filled 2022-08-16: qty 1

## 2022-08-16 MED ORDER — ACETAMINOPHEN 325 MG PO TABS: 325.0000 mg | ORAL_TABLET | ORAL | Status: DC | PRN

## 2022-08-16 MED ORDER — PROPOFOL 10 MG/ML IV BOLUS
INTRAVENOUS | Status: AC
  Filled 2022-08-16: qty 20

## 2022-08-16 MED ORDER — FENTANYL CITRATE (PF) 100 MCG/2ML IJ SOLN: 25.0000 ug | INTRAMUSCULAR | Status: DC | PRN

## 2022-08-16 MED ORDER — PHENYLEPHRINE HCL (PRESSORS) 10 MG/ML IV SOLN
INTRAVENOUS | Status: DC | PRN
  Administered 2022-08-16: 120 ug via INTRAVENOUS
  Administered 2022-08-16: 160 ug via INTRAVENOUS

## 2022-08-16 MED ORDER — DEXMEDETOMIDINE HCL IN NACL 200 MCG/50ML IV SOLN
INTRAVENOUS | Status: DC | PRN
  Administered 2022-08-16: 8 ug via INTRAVENOUS

## 2022-08-16 MED ORDER — ONDANSETRON HCL 4 MG/2ML IJ SOLN
INTRAMUSCULAR | Status: AC
  Filled 2022-08-16: qty 2

## 2022-08-16 MED ORDER — FENTANYL CITRATE (PF) 100 MCG/2ML IJ SOLN
INTRAMUSCULAR | Status: AC
  Filled 2022-08-16: qty 2

## 2022-08-16 MED ORDER — ROCURONIUM BROMIDE 10 MG/ML (PF) SYRINGE
PREFILLED_SYRINGE | INTRAVENOUS | Status: AC
  Filled 2022-08-16: qty 10

## 2022-08-16 MED ORDER — DROPERIDOL 2.5 MG/ML IJ SOLN: 0.6250 mg | Freq: Once | INTRAMUSCULAR | Status: DC | PRN

## 2022-08-16 MED ORDER — IBUPROFEN 800 MG PO TABS: 800.0000 mg | ORAL_TABLET | Freq: Three times a day (TID) | ORAL | 1 refills | Status: DC | PRN

## 2022-08-16 MED ORDER — HYDROCODONE-ACETAMINOPHEN 5-325 MG PO TABS
ORAL_TABLET | ORAL | Status: AC
  Administered 2022-08-16: 1
  Filled 2022-08-16: qty 1

## 2022-08-16 MED ORDER — GABAPENTIN 300 MG PO CAPS: 300.0000 mg | ORAL_CAPSULE | ORAL | Status: AC

## 2022-08-16 MED ORDER — ORAL CARE MOUTH RINSE: 15.0000 mL | Freq: Once | OROMUCOSAL | Status: AC

## 2022-08-16 MED ORDER — CELECOXIB 200 MG PO CAPS: 400.0000 mg | ORAL_CAPSULE | ORAL | Status: AC

## 2022-08-16 MED ORDER — CHLORHEXIDINE GLUCONATE 0.12 % MT SOLN
OROMUCOSAL | Status: AC
  Administered 2022-08-16: 15 mL via OROMUCOSAL
  Filled 2022-08-16: qty 15

## 2022-08-16 MED ORDER — ROCURONIUM BROMIDE 100 MG/10ML IV SOLN
INTRAVENOUS | Status: DC | PRN
  Administered 2022-08-16: 60 mg via INTRAVENOUS

## 2022-08-16 MED ORDER — CELECOXIB 200 MG PO CAPS
ORAL_CAPSULE | ORAL | Status: AC
  Administered 2022-08-16: 400 mg via ORAL
  Filled 2022-08-16: qty 2

## 2022-08-16 MED ORDER — PROPOFOL 500 MG/50ML IV EMUL
INTRAVENOUS | Status: DC | PRN
  Administered 2022-08-16: 200 ug/kg/min via INTRAVENOUS

## 2022-08-16 MED ORDER — EPHEDRINE SULFATE (PRESSORS) 50 MG/ML IJ SOLN
INTRAMUSCULAR | Status: DC | PRN
  Administered 2022-08-16: 5 mg via INTRAVENOUS

## 2022-08-16 MED ORDER — MEPERIDINE HCL 25 MG/ML IJ SOLN: 6.2500 mg | INTRAMUSCULAR | Status: DC | PRN

## 2022-08-16 MED ORDER — LIDOCAINE HCL (CARDIAC) PF 100 MG/5ML IV SOSY
PREFILLED_SYRINGE | INTRAVENOUS | Status: DC | PRN
  Administered 2022-08-16: 100 mg via INTRAVENOUS

## 2022-08-16 MED ORDER — BUPIVACAINE HCL 0.5 % IJ SOLN
INTRAMUSCULAR | Status: DC | PRN
  Administered 2022-08-16: 15 mL

## 2022-08-16 MED ORDER — SUGAMMADEX SODIUM 200 MG/2ML IV SOLN
INTRAVENOUS | Status: DC | PRN
  Administered 2022-08-16: 200 mg via INTRAVENOUS

## 2022-08-16 MED ORDER — DIPHENHYDRAMINE HCL 50 MG/ML IJ SOLN
INTRAMUSCULAR | Status: DC | PRN
  Administered 2022-08-16: 12.5 mg via INTRAVENOUS

## 2022-08-16 MED ORDER — DEXAMETHASONE SODIUM PHOSPHATE 10 MG/ML IJ SOLN
INTRAMUSCULAR | Status: AC
  Filled 2022-08-16: qty 1

## 2022-08-16 MED ORDER — KETAMINE HCL 50 MG/5ML IJ SOSY
PREFILLED_SYRINGE | INTRAMUSCULAR | Status: AC
  Filled 2022-08-16: qty 5

## 2022-08-16 MED ORDER — ESMOLOL HCL 100 MG/10ML IV SOLN
INTRAVENOUS | Status: AC
  Filled 2022-08-16: qty 10

## 2022-08-16 MED ORDER — PROPOFOL 1000 MG/100ML IV EMUL
INTRAVENOUS | Status: AC
  Filled 2022-08-16: qty 100

## 2022-08-16 MED ORDER — FENTANYL CITRATE (PF) 100 MCG/2ML IJ SOLN
INTRAMUSCULAR | Status: DC | PRN
  Administered 2022-08-16: 25 ug via INTRAVENOUS
  Administered 2022-08-16: 50 ug via INTRAVENOUS
  Administered 2022-08-16: 25 ug via INTRAVENOUS

## 2022-08-16 MED ORDER — KETAMINE HCL 10 MG/ML IJ SOLN
INTRAMUSCULAR | Status: DC | PRN
  Administered 2022-08-16: 20 mg via INTRAVENOUS

## 2022-08-16 MED ORDER — ACETAMINOPHEN 500 MG PO TABS
ORAL_TABLET | ORAL | Status: AC
  Administered 2022-08-16: 1000 mg via ORAL
  Filled 2022-08-16: qty 2

## 2022-08-16 MED ORDER — FAMOTIDINE 20 MG PO TABS
ORAL_TABLET | ORAL | Status: AC
  Administered 2022-08-16: 20 mg via ORAL
  Filled 2022-08-16: qty 1

## 2022-08-16 MED ORDER — ACETAMINOPHEN 160 MG/5ML PO SOLN: 325.0000 mg | ORAL | Status: DC | PRN

## 2022-08-16 MED ORDER — CELECOXIB 200 MG PO CAPS
ORAL_CAPSULE | ORAL | Status: AC
  Filled 2022-08-16: qty 1

## 2022-08-16 MED ORDER — PHENYLEPHRINE 80 MCG/ML (10ML) SYRINGE FOR IV PUSH (FOR BLOOD PRESSURE SUPPORT)
PREFILLED_SYRINGE | INTRAVENOUS | Status: AC
  Filled 2022-08-16: qty 10

## 2022-08-16 MED ORDER — DEXAMETHASONE SODIUM PHOSPHATE 10 MG/ML IJ SOLN
INTRAMUSCULAR | Status: DC | PRN
  Administered 2022-08-16: 5 mg via INTRAVENOUS

## 2022-08-16 MED ORDER — HYDROCODONE-ACETAMINOPHEN 7.5-325 MG PO TABS
1.0000 | ORAL_TABLET | Freq: Once | ORAL | Status: DC | PRN
  Filled 2022-08-16: qty 1

## 2022-08-16 MED ORDER — BUPIVACAINE HCL (PF) 0.5 % IJ SOLN
INTRAMUSCULAR | Status: AC
  Filled 2022-08-16: qty 30

## 2022-08-16 MED ORDER — ONDANSETRON HCL 4 MG/2ML IJ SOLN: 4.0000 mg | Freq: Once | INTRAMUSCULAR | Status: DC | PRN

## 2022-08-16 SURGICAL SUPPLY — 44 items
ADH SKN CLS APL DERMABOND .7 (GAUZE/BANDAGES/DRESSINGS) ×1
APL PRP STRL LF DISP 70% ISPRP (MISCELLANEOUS)
BLADE SURG SZ11 CARB STEEL (BLADE) ×1 IMPLANT
CATH ROBINSON RED A/P 16FR (CATHETERS) ×1 IMPLANT
CHLORAPREP W/TINT 26 (MISCELLANEOUS) ×1 IMPLANT
CORD MONOPOLAR M/FML 12FT (MISCELLANEOUS) IMPLANT
DERMABOND ADVANCED .7 DNX12 (GAUZE/BANDAGES/DRESSINGS) ×1 IMPLANT
DRSG TELFA 3X4 N-ADH STERILE (GAUZE/BANDAGES/DRESSINGS) IMPLANT
GAUZE 4X4 16PLY ~~LOC~~+RFID DBL (SPONGE) ×2 IMPLANT
GLOVE BIO SURGEON STRL SZ 6.5 (GLOVE) ×1 IMPLANT
GLOVE PI ORTHO PRO STRL 7.5 (GLOVE) ×1 IMPLANT
GLOVE SURG UNDER LTX SZ7 (GLOVE) ×1 IMPLANT
GOWN STRL REUS W/ TWL LRG LVL3 (GOWN DISPOSABLE) ×3 IMPLANT
GOWN STRL REUS W/TWL LRG LVL3 (GOWN DISPOSABLE) ×2
GOWN STRL REUS W/TWL XL LVL4 (GOWN DISPOSABLE) ×1 IMPLANT
GRASPER SUT TROCAR 14GX15 (MISCELLANEOUS) IMPLANT
IRRIGATION STRYKERFLOW (MISCELLANEOUS) ×1 IMPLANT
IRRIGATOR STRYKERFLOW (MISCELLANEOUS)
IV LACTATED RINGERS 1000ML (IV SOLUTION) ×1 IMPLANT
KIT PINK PAD W/HEAD ARE REST (MISCELLANEOUS)
KIT PINK PAD W/HEAD ARM REST (MISCELLANEOUS) ×1 IMPLANT
KIT TURNOVER CYSTO (KITS) ×1 IMPLANT
MANIFOLD NEPTUNE II (INSTRUMENTS) ×1 IMPLANT
NS IRRIG 500ML POUR BTL (IV SOLUTION) ×1 IMPLANT
PACK GYN LAPAROSCOPIC (MISCELLANEOUS) ×1 IMPLANT
PAD OB MATERNITY 4.3X12.25 (PERSONAL CARE ITEMS) ×1 IMPLANT
PAD PREP 24X41 OB/GYN DISP (PERSONAL CARE ITEMS) ×1 IMPLANT
POUCH ENDO CATCH 10MM SPEC (MISCELLANEOUS) IMPLANT
SCISSORS METZENBAUM CVD 33 (INSTRUMENTS) IMPLANT
SCRUB CHG 4% DYNA-HEX 4OZ (MISCELLANEOUS) ×1 IMPLANT
SET TUBE SMOKE EVAC HIGH FLOW (TUBING) ×1 IMPLANT
SHEARS HARMONIC ACE PLUS 36CM (ENDOMECHANICALS) IMPLANT
SLEEVE Z-THREAD 5X100MM (TROCAR) ×1 IMPLANT
SUT VIC AB 3-0 SH 27 (SUTURE)
SUT VIC AB 3-0 SH 27X BRD (SUTURE) IMPLANT
SUT VIC AB 4-0 FS2 27 (SUTURE) ×1 IMPLANT
SUT VICRYL 0 AB UR-6 (SUTURE) ×1 IMPLANT
SYS BAG RETRIEVAL 10MM (BASKET)
SYSTEM BAG RETRIEVAL 10MM (BASKET) IMPLANT
TRAP FLUID SMOKE EVACUATOR (MISCELLANEOUS) ×1 IMPLANT
TROCAR XCEL NON-BLD 5MMX100MML (ENDOMECHANICALS) ×1 IMPLANT
TROCAR XCEL UNIV SLVE 11M 100M (ENDOMECHANICALS) IMPLANT
TROCAR Z-THRD FIOS HNDL 11X100 (TROCAR) ×1 IMPLANT
WATER STERILE IRR 500ML POUR (IV SOLUTION) ×1 IMPLANT

## 2022-08-16 NOTE — Anesthesia Procedure Notes (Signed)
Procedure Name: Intubation Date/Time: 08/16/2022 2:40 PM  Performed by: Lia Foyer, CRNAPre-anesthesia Checklist: Patient identified, Emergency Drugs available, Suction available and Patient being monitored Patient Re-evaluated:Patient Re-evaluated prior to induction Oxygen Delivery Method: Circle system utilized Preoxygenation: Pre-oxygenation with 100% oxygen Induction Type: IV induction Ventilation: Mask ventilation without difficulty Laryngoscope Size: McGraph and 3 Grade View: Grade I Tube type: Oral Tube size: 6.5 mm Number of attempts: 1 Airway Equipment and Method: Stylet and Video-laryngoscopy Placement Confirmation: ETT inserted through vocal cords under direct vision, positive ETCO2 and breath sounds checked- equal and bilateral Secured at: 20 cm Tube secured with: Tape Dental Injury: Teeth and Oropharynx as per pre-operative assessment

## 2022-08-16 NOTE — Anesthesia Preprocedure Evaluation (Addendum)
Anesthesia Evaluation  Patient identified by MRN, date of birth, ID band Patient awake    Reviewed: Allergy & Precautions, NPO status , Patient's Chart, lab work & pertinent test results  History of Anesthesia Complications (+) PONV and history of anesthetic complications  Airway Mallampati: II  TM Distance: >3 FB Neck ROM: full    Dental  (+) Chipped   Pulmonary neg pulmonary ROS,    Pulmonary exam normal        Cardiovascular negative cardio ROS Normal cardiovascular exam     Neuro/Psych  Headaches, PSYCHIATRIC DISORDERS Anxiety Depression    GI/Hepatic negative GI ROS, Neg liver ROS, neg GERD  ,  Endo/Other  negative endocrine ROS  Renal/GU      Musculoskeletal   Abdominal   Peds  Hematology negative hematology ROS (+)   Anesthesia Other Findings Past Medical History: No date: Anxiety No date: Depression No date: Headache(784.0) No date: Internal hemorrhoids No date: IUD contraception     Comment:  mirena 07/2022: Ovarian cyst, left No date: PONV (postoperative nausea and vomiting)     Comment:  after c-section  Past Surgical History: No date: BREAST SURGERY; Right     Comment:  Galactocele 2008: CESAREAN SECTION 05/11/2016: CESAREAN SECTION; N/A     Comment:  Procedure: REPEAT CESAREAN SECTION;  Surgeon: [name], MD;  Location: [facility] ORS;  Service: Obstetrics;                Laterality: N/A; 2020: HEMORRHOID BANDING No date: WISDOM TOOTH EXTRACTION  BMI    Body Mass Index: 26.16 kg/m      Reproductive/Obstetrics negative OB ROS                            Anesthesia Physical Anesthesia Plan  ASA: 2  Anesthesia Plan: General ETT   Post-op Pain Management: Precedex   Induction: Intravenous  PONV Risk Score and Plan: 4 or greater and Ondansetron, Dexamethasone, Midazolam, Treatment may vary due to age or medical condition and  Diphenhydramine  Airway Management Planned: Oral ETT  Additional Equipment:   Intra-op Plan:   Post-operative Plan: Extubation in OR  Informed Consent: I have reviewed the patients History and Physical, chart, labs and discussed the procedure including the risks, benefits and alternatives for the proposed anesthesia with the patient or authorized representative who has indicated his/her understanding and acceptance.     Dental Advisory Given  Plan Discussed with: Anesthesiologist, CRNA and Surgeon  Anesthesia Plan Comments: (Patient consented for risks of anesthesia including but not limited to:  - adverse reactions to medications - damage to eyes, teeth, lips or other oral mucosa - nerve damage due to positioning  - sore throat or hoarseness - Damage to heart, brain, nerves, lungs, other parts of body or loss of life  Patient voiced understanding.)       Anesthesia Quick Evaluation

## 2022-08-16 NOTE — Interval H&P Note (Signed)
History and Physical Interval Note:  08/16/2022 2:17 PM  Jenny Delgado  has presented today for surgery, with the diagnosis of persistent left ovarian cyst.  The various methods of treatment have been discussed with the patient and family. After consideration of risks, benefits and other options for treatment, the patient has consented to  Procedure(s): LEFT OVARIAN CYSTECTOMY (Left) as a surgical intervention.  The patient's history has been reviewed, patient examined, no change in status, stable for surgery.  I have reviewed the patient's chart and labs.  Questions were answered to the patient's satisfaction.     [name], MD [facility]

## 2022-08-16 NOTE — Op Note (Addendum)
Procedure(s): LEFT OVARIAN CYSTECTOMY, RIGHT OVARIAN CYST ASPIRATION, PERITONEAL BIOPSIES WITH EXCISION AND FULGURATION OF ENDOMETRIOSIS  Procedure Note  Jenny Delgado female 31 y.o. 08/16/2022  Indications: The patient is a 31 y.o. [id number] female with pelvic pain, persistent left adnexal cyst (suspected endometrioma, 3 cm), failed 2 trials of short term continuous OCPs.   Pre-operative Diagnosis: Pelvic pain, persistent left adnexal cyst  Post-operative Diagnosis: Same, with right ovarian simple cyst, several powder burn lesions in lower pelvis suspicious for endometriosis, pelvic adhesions  Surgeon: Hildred Laser, MD  Assistants:  Brennan Bailey, MD.   Anesthesia: General endotracheal anesthesia  Findings: The uterus was sounded to 8 cm. IUD threads visible (0.5 cm) at cervical os) Uterus appeared normal. Some scarring of the bladder flap noted at lower uterine segment.  Fallopian tubes appeared normal bilaterally.  Left ovary with small purple-brown cyst (endometrioma) and right ovary with simple cyst.  Several powder-burn lesions in left ovarian fossa and posterior cul-de-sac.  Adhesions of omentum to the anterior abdominal wall.   Procedure Details: The patient was seen in the Holding Room. The risks, benefits, complications, treatment options, and expected outcomes were discussed with the patient.  The patient concurred with the proposed plan, giving informed consent.  The site of surgery properly noted/marked. The patient was taken to the Operating Room, identified as Jenny Delgado and the procedure verified as Procedure(s) (LRB): LEFT OVARIAN CYSTECTOMY (Left). A Time Out was held and the above information confirmed.  She was then placed under general anesthesia without difficulty. She was placed in the dorsal lithotomy position, and was prepped and draped in a sterile manner.  A straight catheterization was performed. A sterile speculum was inserted into the vagina and the  cervix was grasped at the anterior lip using a single-toothed tenaculum.  IUD threads visible 0.5 cm from external os. The uterus was sounded to 8 cm, and a Hulka clamp was placed for uterine manipulation.  The speculum and tenaculum were then removed. After an adequate timeout was performed, attention was turned to the abdomen where an umbilical incision was made with the scalpel.  The Optiview 5-mm trocar and sleeve were then advanced without difficulty with the laparoscope under direct visualization into the abdomen.  The abdomen was then insufflated with carbon dioxide gas and adequate pneumoperitoneum was obtained. Bilateral 5-mm lower quadrant ports were then placed under direct visualization.  A survey of the patient's pelvis and abdomen revealed the findings as above.  Monopolar scissors were then used to lyse the omental adhesions to the anterior abdominal wall. On the left side, the fallopian tube and ovary were grasped.  The left ovarian cyst was incised to reveal brown thick fluid ("chocolate cyst").  The cyst was then excised from the ovary. Next, the right ovary was incised and drained, with expression of serous fluid.  After this, biopsy forceps were used to take peritoneal biopsies of the left ovarian fossa, posterior cul-de-sac, and central-right utero-sacral ligament.  The Kleepingers were then activated to perform fulguration of the biopsy sites and to obtain hemostasis.  Good hemostasis was noted after a final survey was performed.  All trocars were removed under direct visualization, and the abdomen which was desufflated.    All skin incisions were closed with 4-0 Monocryl subcuticular stitches. The incisions were injected with a total of 15 ml of 0.5% Marcaine.  Dermabond was placed over all incisions.  The patient tolerated the procedures well.  All instruments, needles, and sponge counts were correct x 2. The  patient was taken to the recovery room awake, extubated and in stable condition.    An experienced assistant was required given the standard of surgical care given the complexity of the case.  This assistant was needed for exposure, dissection, suctioning, retraction, instrument exchange, and for overall help during the procedure.   Estimated Blood Loss:  10 ml      Drains: straight catheterization prior to procedure with  400 ml of clear urine         Total IV Fluids:  700 ml  Specimens: Peritoneal biopsies of left ovarian fossa and posterior cul-de sac, left ovarian cyst wall.         Implants: None         Complications:  None; patient tolerated the procedure well.         Disposition: PACU - hemodynamically stable.         Condition: stable   [name], MD [facility]

## 2022-08-16 NOTE — Anesthesia Postprocedure Evaluation (Signed)
Anesthesia Post Note  Patient: Jenny Delgado  Procedure(s) Performed: LEFT OVARIAN CYSTECTOMY and aspiration of right ovarian cyst (Left)  Patient location during evaluation: PACU Anesthesia Type: General Level of consciousness: awake and alert Pain management: pain level controlled Vital Signs Assessment: post-procedure vital signs reviewed and stable Respiratory status: spontaneous breathing, nonlabored ventilation, respiratory function stable and patient connected to nasal cannula oxygen Cardiovascular status: blood pressure returned to baseline and stable Postop Assessment: no apparent nausea or vomiting Anesthetic complications: no   No notable events documented.   Last Vitals:  Vitals:   08/16/22 1710 08/16/22 1721  BP: 109/70 110/65  Pulse: 67 77  Resp: 13 14  Temp:  36.7 C  SpO2: 100% 100%    Last Pain:  Vitals:   08/16/22 1721  TempSrc: Temporal  PainSc: 4                  Arita Miss

## 2022-08-16 NOTE — Discharge Instructions (Signed)

## 2022-08-16 NOTE — Transfer of Care (Signed)
Immediate Anesthesia Transfer of Care Note  Patient: Jenny Delgado  Procedure(s) Performed: LEFT OVARIAN CYSTECTOMY (Left)  Patient Location: PACU  Anesthesia Type:General  Level of Consciousness: sedated  Airway & Oxygen Therapy: Patient Spontanous Breathing and Patient connected to face mask oxygen  Post-op Assessment: Report given to RN and Post -op Vital signs reviewed and stable  Post vital signs: Reviewed  Last Vitals:  Vitals Value Taken Time  BP    Temp    Pulse 88 08/16/22 1557  Resp 18 08/16/22 1557  SpO2 100 % 08/16/22 1557  Vitals shown include unvalidated device data.  Last Pain:         Complications: No notable events documented.

## 2022-08-17 ENCOUNTER — Encounter: Payer: Self-pay | Admitting: Obstetrics and Gynecology

## 2022-08-17 ENCOUNTER — Encounter: Payer: BC Managed Care – PPO | Admitting: Obstetrics and Gynecology

## 2022-08-18 ENCOUNTER — Encounter: Payer: Self-pay | Admitting: Obstetrics and Gynecology

## 2022-08-18 LAB — SURGICAL PATHOLOGY

## 2022-08-27 NOTE — Progress Notes (Unsigned)
    OBSTETRICS/GYNECOLOGY POST-OPERATIVE CLINIC VISIT  Subjective:     Jenny Delgado is a 31 y.o. female who presents to the clinic {1-10:13787} weeks status post  LEFT OVARIAN CYSTECTOMY, RIGHT OVARIAN CYST ASPIRATION, PERITONEAL BIOPSIES WITH EXCISION AND FULGURATION OF ENDOMETRIOSI  for   pelvic pain, persistent left adnexal cyst (suspected endometrioma, 3 cm), failed 2 trials of short term continuous OCPs.  . Eating a regular diet {with-without:5700} difficulty. Bowel movements are {normal/abnormal***:19619}. The patient is not having any pain.  The following portions of the patient's history were reviewed and updated as appropriate: allergies, current medications, past family history, past medical history, past social history, past surgical history, and problem list.  Review of Systems Pertinent items are noted in HPI.   Objective:   There were no vitals taken for this visit. There is no height or weight on file to calculate BMI.  General:  alert and no distress  Abdomen: soft, bowel sounds active, non-tender  Incision:   healing well, no drainage, no erythema, no hernia, no seroma, no swelling, no dehiscence, incision well approximated    Pathology:    Assessment:   Patient s/p *** (surgery)  {doing well:13525::"Doing well postoperatively."}   Plan:   1. Continue any current medications as instructed by provider. 2. Wound care discussed. 3. Operative findings again reviewed. Pathology report discussed. 4. Activity restrictions: {restrictions:13723} 5. Anticipated return to work: {work return:14002}. 6. Follow up: {4-08:14481} {time; units:18646} for ***    Loney Laurence, CMA [facility]

## 2022-08-31 ENCOUNTER — Ambulatory Visit (INDEPENDENT_AMBULATORY_CARE_PROVIDER_SITE_OTHER): Payer: BC Managed Care – PPO | Admitting: Obstetrics and Gynecology

## 2022-08-31 ENCOUNTER — Encounter: Payer: Self-pay | Admitting: Obstetrics and Gynecology

## 2022-08-31 VITALS — BP 119/85 | HR 77 | Resp 16 | Ht 63.0 in | Wt 146.6 lb

## 2022-08-31 DIAGNOSIS — Z4889 Encounter for other specified surgical aftercare: Secondary | ICD-10-CM

## 2022-08-31 DIAGNOSIS — N809 Endometriosis, unspecified: Secondary | ICD-10-CM

## 2022-08-31 DIAGNOSIS — Z975 Presence of (intrauterine) contraceptive device: Secondary | ICD-10-CM

## 2023-02-16 NOTE — Telephone Encounter (Signed)
Error

## 2023-03-01 ENCOUNTER — Other Ambulatory Visit: Payer: Self-pay

## 2023-03-01 ENCOUNTER — Emergency Department (HOSPITAL_COMMUNITY): Payer: No Typology Code available for payment source

## 2023-03-01 ENCOUNTER — Emergency Department (HOSPITAL_COMMUNITY)
Admission: EM | Admit: 2023-03-01 | Discharge: 2023-03-01 | Disposition: A | Discharge status: Home / Self Care | Payer: No Typology Code available for payment source | Attending: Emergency Medicine | Admitting: Emergency Medicine

## 2023-03-01 ENCOUNTER — Encounter (HOSPITAL_COMMUNITY): Payer: Self-pay

## 2023-03-01 DIAGNOSIS — M79671 Pain in right foot: Secondary | ICD-10-CM | POA: Diagnosis present

## 2023-03-01 DIAGNOSIS — R1012 Left upper quadrant pain: Secondary | ICD-10-CM | POA: Insufficient documentation

## 2023-03-01 DIAGNOSIS — S81011A Laceration without foreign body, right knee, initial encounter: Secondary | ICD-10-CM | POA: Insufficient documentation

## 2023-03-01 DIAGNOSIS — R000 Tachycardia, unspecified: Secondary | ICD-10-CM | POA: Insufficient documentation

## 2023-03-01 DIAGNOSIS — S93124A Dislocation of metatarsophalangeal joint of right lesser toe(s), initial encounter: Secondary | ICD-10-CM | POA: Diagnosis not present

## 2023-03-01 DIAGNOSIS — Y92410 Unspecified street and highway as the place of occurrence of the external cause: Secondary | ICD-10-CM | POA: Insufficient documentation

## 2023-03-01 DIAGNOSIS — S93121A Dislocation of metatarsophalangeal joint of right great toe, initial encounter: Secondary | ICD-10-CM | POA: Diagnosis not present

## 2023-03-01 DIAGNOSIS — T07XXXA Unspecified multiple injuries, initial encounter: Secondary | ICD-10-CM

## 2023-03-01 DIAGNOSIS — S62304A Unspecified fracture of fourth metacarpal bone, right hand, initial encounter for closed fracture: Secondary | ICD-10-CM | POA: Diagnosis not present

## 2023-03-01 DIAGNOSIS — R1032 Left lower quadrant pain: Secondary | ICD-10-CM | POA: Diagnosis not present

## 2023-03-01 DIAGNOSIS — S0003XA Contusion of scalp, initial encounter: Secondary | ICD-10-CM | POA: Diagnosis not present

## 2023-03-01 DIAGNOSIS — S93104A Unspecified dislocation of right toe(s), initial encounter: Secondary | ICD-10-CM

## 2023-03-01 DIAGNOSIS — S20319A Abrasion of unspecified front wall of thorax, initial encounter: Secondary | ICD-10-CM | POA: Insufficient documentation

## 2023-03-01 LAB — COMPREHENSIVE METABOLIC PANEL
ALT: 29 U/L (ref 0–44)
AST: 46 U/L — ABNORMAL HIGH (ref 15–41)
Albumin: 3.8 g/dL (ref 3.5–5.0)
Alkaline Phosphatase: 46 U/L (ref 38–126)
Anion gap: 10 (ref 5–15)
BUN: 9 mg/dL (ref 6–20)
CO2: 20 mmol/L — ABNORMAL LOW (ref 22–32)
Calcium: 8.4 mg/dL — ABNORMAL LOW (ref 8.9–10.3)
Chloride: 109 mmol/L (ref 98–111)
Creatinine, Ser: 0.79 mg/dL (ref 0.44–1.00)
GFR, Estimated: >60 mL/min (ref >60)
Glucose, Bld: 121 mg/dL — ABNORMAL HIGH (ref 70–99)
Potassium: 3.4 mmol/L — ABNORMAL LOW (ref 3.5–5.1)
Sodium: 139 mmol/L (ref 135–145)
Total Bilirubin: 0.4 mg/dL (ref 0.3–1.2)
Total Protein: 6.5 g/dL (ref 6.5–8.1)

## 2023-03-01 LAB — I-STAT CHEM 8, ED
BUN: 9 mg/dL (ref 6–20)
Calcium, Ion: 1.16 mmol/L (ref 1.15–1.40)
Chloride: 104 mmol/L (ref 98–111)
Creatinine, Ser: 0.7 mg/dL (ref 0.44–1.00)
Glucose, Bld: 119 mg/dL — ABNORMAL HIGH (ref 70–99)
HCT: 39 % (ref 36.0–46.0)
Hemoglobin: 13.3 g/dL (ref 12.0–15.0)
Potassium: 3.1 mmol/L — ABNORMAL LOW (ref 3.5–5.1)
Sodium: 141 mmol/L (ref 135–145)
TCO2: 25 mmol/L (ref 22–32)

## 2023-03-01 LAB — CBC
HCT: 40.4 % (ref 36.0–46.0)
Hemoglobin: 12.8 g/dL (ref 12.0–15.0)
MCH: 27.7 pg (ref 26.0–34.0)
MCHC: 31.7 g/dL (ref 30.0–36.0)
MCV: 87.4 fL (ref 80.0–100.0)
Platelets: 200 10*3/uL (ref 150–400)
RBC: 4.62 MIL/uL (ref 3.87–5.11)
RDW: 12.3 % (ref 11.5–15.5)
WBC: 14.8 10*3/uL — ABNORMAL HIGH (ref 4.0–10.5)
nRBC: 0 % (ref 0.0–0.2)

## 2023-03-01 LAB — PROTIME-INR
INR: 1 (ref 0.8–1.2)
Prothrombin Time: 13.7 seconds (ref 11.4–15.2)

## 2023-03-01 LAB — I-STAT BETA HCG BLOOD, ED (MC, WL, AP ONLY): I-stat hCG, quantitative: <5 m[IU]/mL (ref <5)

## 2023-03-01 LAB — LACTIC ACID, PLASMA: Lactic Acid, Venous: 1.8 mmol/L (ref 0.5–1.9)

## 2023-03-01 LAB — ETHANOL: Alcohol, Ethyl (B): <10 mg/dL (ref <10)

## 2023-03-01 MED ORDER — LIDOCAINE-EPINEPHRINE (PF) 2 %-1:200000 IJ SOLN
20.0000 mL | Freq: Once | INTRAMUSCULAR | Status: AC
  Administered 2023-03-01: 20 mL
  Filled 2023-03-01: qty 20

## 2023-03-01 MED ORDER — IOHEXOL 350 MG/ML SOLN
56.0000 mL | Freq: Once | INTRAVENOUS | Status: AC | PRN
  Administered 2023-03-01: 56 mL via INTRAVENOUS

## 2023-03-01 MED ORDER — ONDANSETRON HCL 4 MG/2ML IJ SOLN
4.0000 mg | Freq: Once | INTRAMUSCULAR | Status: AC
  Administered 2023-03-01: 4 mg via INTRAVENOUS
  Filled 2023-03-01: qty 2

## 2023-03-01 MED ORDER — OXYCODONE HCL 5 MG PO TABS: 5.0000 mg | ORAL_TABLET | Freq: Four times a day (QID) | ORAL | 0 refills | Status: AC | PRN

## 2023-03-01 MED ORDER — ONDANSETRON HCL 4 MG PO TABS: 4.0000 mg | ORAL_TABLET | Freq: Three times a day (TID) | ORAL | 0 refills | Status: AC | PRN

## 2023-03-01 MED ORDER — IBUPROFEN 600 MG PO TABS: 600.0000 mg | ORAL_TABLET | Freq: Three times a day (TID) | ORAL | 0 refills | Status: AC | PRN

## 2023-03-01 MED ORDER — PROPOFOL 10 MG/ML IV BOLUS
0.5000 mg/kg | Freq: Once | INTRAVENOUS | Status: AC
  Administered 2023-03-01: 33 mg via INTRAVENOUS
  Filled 2023-03-01: qty 20

## 2023-03-01 MED ORDER — METOCLOPRAMIDE HCL 5 MG/ML IJ SOLN
10.0000 mg | Freq: Once | INTRAMUSCULAR | Status: AC
  Administered 2023-03-01: 10 mg via INTRAVENOUS
  Filled 2023-03-01: qty 2

## 2023-03-01 MED ORDER — PROPOFOL 10 MG/ML IV BOLUS
INTRAVENOUS | Status: AC | PRN
  Administered 2023-03-01 (×2): 10 mg via INTRAVENOUS

## 2023-03-01 MED ORDER — DIPHENHYDRAMINE HCL 50 MG/ML IJ SOLN
25.0000 mg | Freq: Once | INTRAMUSCULAR | Status: AC
  Administered 2023-03-01: 25 mg via INTRAVENOUS
  Filled 2023-03-01: qty 1

## 2023-03-01 MED ORDER — MORPHINE SULFATE (PF) 2 MG/ML IV SOLN
2.0000 mg | Freq: Once | INTRAVENOUS | Status: AC
  Administered 2023-03-01: 2 mg via INTRAVENOUS
  Filled 2023-03-01: qty 1

## 2023-03-01 MED ORDER — HYDROMORPHONE HCL 1 MG/ML IJ SOLN
2.0000 mg | Freq: Once | INTRAMUSCULAR | Status: AC
  Administered 2023-03-01: 2 mg via INTRAVENOUS
  Filled 2023-03-01: qty 2

## 2023-03-01 MED ORDER — KETOROLAC TROMETHAMINE 15 MG/ML IJ SOLN
15.0000 mg | Freq: Once | INTRAMUSCULAR | Status: AC
  Administered 2023-03-01: 15 mg via INTRAVENOUS
  Filled 2023-03-01: qty 1

## 2023-03-01 MED ORDER — CYCLOBENZAPRINE HCL 10 MG PO TABS: 10.0000 mg | ORAL_TABLET | Freq: Three times a day (TID) | ORAL | 0 refills | Status: AC | PRN
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
## 2023-03-01 NOTE — Progress Notes (Signed)
Orthopedic Tech Progress Note Patient Details:  Jenny Delgado 1991/09/15 [id number]  Ortho Devices Type of Ortho Device: Crutches, CAM walker, Short arm splint Ortho Device/Splint Location: RUE Ortho Device/Splint Interventions: Ordered, Application, Adjustment   Post Interventions Patient Tolerated: Well Instructions Provided: Adjustment of device, Care of device, Poper ambulation with device  Jenny Delgado Jenny Delgado 03/01/2023, 10:51 PM

## 2023-03-01 NOTE — Progress Notes (Signed)
Pt placed on ETCO2 Craig @2L  for conscious sedation. Ambu bag @bedside . Pt tol well with RR 16, ETCO2 30-36 and O2 Sats 100% throughout procedure.

## 2023-03-01 NOTE — ED Notes (Signed)
Patient A&O, in no acute distress, talking and answering questions.
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
## 2023-03-01 NOTE — ED Notes (Signed)
Reduction completed by Provider.

## 2023-03-01 NOTE — Discharge Instructions (Addendum)
Thank you for letting us take care of you today. We placed 4 sutures to the cut on your knee. Please have these removed in 10-14 days.   You had multiple fractures/dislocations to your toes as we discussed. We were able to successfully get them back in place and put you in a splint but you will need to follow up with orthopedics for reassessment of this and your metacarpal fracture.  You had an incidental finding of a left adnexa cyst with recommendation for outpatient ultrasound. Please discuss this with your PCP or OB/GYN.  I prescribed muscle relaxers, NSAIDs, and pain medication for home. Please use non-narcotic pain medication such as the ibuprofen prescribed and over the counter Tylenol and then narcotic pain medication for severe or breakthrough pain.  For any new or worsening symptoms, please return to nearest emergency department for re-evaluation.

## 2023-03-08 ENCOUNTER — Other Ambulatory Visit: Payer: Self-pay | Admitting: Orthopaedic Surgery

## 2023-03-08 ENCOUNTER — Encounter: Payer: Self-pay | Admitting: Orthopaedic Surgery

## 2023-03-08 DIAGNOSIS — M79671 Pain in right foot: Secondary | ICD-10-CM

## 2023-03-08 DIAGNOSIS — M79672 Pain in left foot: Secondary | ICD-10-CM

## 2023-03-16 ENCOUNTER — Ambulatory Visit
Admission: RE | Admit: 2023-03-16 | Discharge: 2023-03-16 | Disposition: A | Discharge status: Home / Self Care | Payer: No Typology Code available for payment source | Source: Ambulatory Visit | Attending: Orthopaedic Surgery | Admitting: Orthopaedic Surgery

## 2023-03-16 DIAGNOSIS — M79671 Pain in right foot: Secondary | ICD-10-CM

## 2023-03-16 DIAGNOSIS — M79672 Pain in left foot: Secondary | ICD-10-CM

## 2023-06-28 NOTE — Progress Notes (Unsigned)
    GYNECOLOGY PROGRESS NOTE  Subjective:    Patient ID: Jenny Delgado, female    DOB: 1990/11/23, 32 y.o.   MRN: [id number]  HPI  Patient is a 32 y.o. [id number] female who presents for follow up on ovarian cyst.  {Common ambulatory SmartLinks:19316}  Review of Systems {ros; complete:30496}   Objective:   There were no vitals taken for this visit. There is no height or weight on file to calculate BMI. General appearance: {general exam:16600} Abdomen: {abdominal exam:16834} Pelvic: {pelvic exam:16852::"cervix normal in appearance","external genitalia normal","no adnexal masses or tenderness","no cervical motion tenderness","rectovaginal septum normal","uterus normal size, shape, and consistency","vagina normal without discharge"} Extremities: {extremity exam:5109} Neurologic: {neuro exam:17854}   Assessment:   1. Left ovarian cyst   2. History of ovarian cyst      Plan:   There are no diagnoses linked to this encounter.    Hildred Laser, MD [facility]

## 2023-06-29 ENCOUNTER — Encounter: Payer: Self-pay | Admitting: Obstetrics and Gynecology

## 2023-06-29 ENCOUNTER — Ambulatory Visit (INDEPENDENT_AMBULATORY_CARE_PROVIDER_SITE_OTHER): Payer: No Typology Code available for payment source | Admitting: Obstetrics and Gynecology

## 2023-06-29 VITALS — BP 120/73 | HR 88 | Resp 16 | Ht 63.0 in | Wt 152.5 lb

## 2023-06-29 DIAGNOSIS — Z8742 Personal history of other diseases of the female genital tract: Secondary | ICD-10-CM

## 2023-06-29 DIAGNOSIS — N809 Endometriosis, unspecified: Secondary | ICD-10-CM

## 2023-06-29 DIAGNOSIS — N83202 Unspecified ovarian cyst, left side: Secondary | ICD-10-CM

## 2023-06-29 DIAGNOSIS — Z975 Presence of (intrauterine) contraceptive device: Secondary | ICD-10-CM

## 2023-07-26 ENCOUNTER — Other Ambulatory Visit: Payer: Self-pay | Admitting: Obstetrics and Gynecology

## 2023-07-26 ENCOUNTER — Ambulatory Visit: Payer: No Typology Code available for payment source

## 2023-07-26 DIAGNOSIS — N809 Endometriosis, unspecified: Secondary | ICD-10-CM

## 2023-07-26 DIAGNOSIS — Z8742 Personal history of other diseases of the female genital tract: Secondary | ICD-10-CM | POA: Diagnosis not present

## 2023-07-26 DIAGNOSIS — N83202 Unspecified ovarian cyst, left side: Secondary | ICD-10-CM | POA: Diagnosis not present

## 2023-07-27 ENCOUNTER — Encounter: Payer: Self-pay | Admitting: Obstetrics and Gynecology

## 2023-08-01 ENCOUNTER — Telehealth: Payer: Self-pay | Admitting: Obstetrics and Gynecology

## 2023-08-01 NOTE — Telephone Encounter (Signed)
The patient contacted the office this morning to see if she could do a telehealth / my chart pre op appointment. Pre op for removal for endometrium the patient is requesting as soon as possible.I have offer the patient 11/5 for in person. The patient is requesting surgery ASAP. Can we added her this week? Also doesn't she need to be in person?

## 2023-08-01 NOTE — Telephone Encounter (Signed)
I was able to offer the patient, Wednesday, 10/16 at 9:15 am due to cancellation.

## 2023-08-01 NOTE — Telephone Encounter (Signed)
I don't have anything on my schedule for this week, but it can be a televisit sometime early next week if she desires.

## 2023-08-03 ENCOUNTER — Encounter: Payer: Self-pay | Admitting: Obstetrics and Gynecology

## 2023-08-03 ENCOUNTER — Telehealth (INDEPENDENT_AMBULATORY_CARE_PROVIDER_SITE_OTHER): Payer: No Typology Code available for payment source | Admitting: Obstetrics and Gynecology

## 2023-08-03 VITALS — Resp 16 | Ht 63.0 in | Wt 150.0 lb

## 2023-08-03 DIAGNOSIS — R102 Pelvic and perineal pain: Secondary | ICD-10-CM | POA: Diagnosis not present

## 2023-08-03 DIAGNOSIS — Z01818 Encounter for other preprocedural examination: Secondary | ICD-10-CM | POA: Diagnosis not present

## 2023-08-03 DIAGNOSIS — N80129 Deep endometriosis of ovary, unspecified ovary: Secondary | ICD-10-CM | POA: Diagnosis not present

## 2023-08-03 NOTE — Patient Instructions (Signed)
GYNECOLOGY PRE-OPERATIVE INSTRUCTIONS  You are scheduled for surgery on 08/22/2023.  The name of your procedure is: Laparoscopic left oophorectomy.   Please read through these instructions carefully regarding preparation for your surgery: Nothing to eat after midnight on the day prior to surgery.  Do not take any medications unless recommended by your provider on day prior to surgery.  Do not take NSAIDs (Motrin, Aleve) or aspirin 7 days prior to surgery.  You may take Tylenol products for minor aches and pains.  You will receive a prescription for pain medications post-operatively.  You will be contacted by phone approximately 1-2 weeks prior to surgery to schedule your pre-operative appointment.  If you are being admitted to the hospital for an overnight stay, you will require COVID testing prior to surgery. Instructions will be given to you on when and where to have this performed.  Please call the office if you have any questions regarding your upcoming surgery.    Thank you for choosing [facility].
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
## 2023-08-15 ENCOUNTER — Encounter
Admission: RE | Admit: 2023-08-15 | Discharge: 2023-08-15 | Disposition: A | Discharge status: Home / Self Care | Payer: No Typology Code available for payment source | Source: Ambulatory Visit | Attending: Obstetrics and Gynecology | Admitting: Obstetrics and Gynecology

## 2023-08-15 DIAGNOSIS — Z01818 Encounter for other preprocedural examination: Secondary | ICD-10-CM

## 2023-08-15 HISTORY — DX: Deep endometriosis of ovary, unspecified ovary: N80.129

## 2023-08-15 HISTORY — DX: Gastro-esophageal reflux disease without esophagitis: K21.9

## 2023-08-15 NOTE — Patient Instructions (Addendum)
Your procedure is scheduled on:08-22-23 Monday Report to the Registration Desk on the 1st floor of the Medical Mall.Then proceed to the 2nd floor Surgery Desk To find out your arrival time, please call [phone] between 1PM - 3PM on:08-19-23 Friday If your arrival time is 6:00 am, do not arrive before that time as the Medical Mall entrance doors do not open until 6:00 am.  REMEMBER: Instructions that are not followed completely may result in serious medical risk, up to and including death; or upon the discretion of your surgeon and anesthesiologist your surgery may need to be rescheduled.  Do not eat food after midnight the night before surgery.  No gum chewing or hard candies.  You may however, drink CLEAR liquids up to 2 hours before you are scheduled to arrive for your surgery. Do not drink anything within 2 hours of your scheduled arrival time.  Clear liquids include: - water  - apple juice without pulp - gatorade (not RED colors) - black coffee or tea (Do NOT add milk or creamers to the coffee or tea) Do NOT drink anything that is not on this list.  In addition, your doctor has ordered for you to drink the provided:  Ensure Pre-Surgery Clear Carbohydrate Drink  Drinking this carbohydrate drink up to two hours before surgery helps to reduce insulin resistance and improve patient outcomes. Please complete drinking 2 hours before scheduled arrival time.  One week prior to surgery:Last dose today (08-15-23) Stop Anti-inflammatories (NSAIDS) such as Advil, Aleve, Ibuprofen, Motrin, Naproxen, Naprosyn and Aspirin based products such as Excedrin, Goody's Powder, BC Powder. Stop ANY OVER THE COUNTER supplements until after surgery  You may however, continue to take Tylenol if needed for pain up until the day of surgery.  Continue taking all of your other prescription medications up until the day of surgery.  ON THE DAY OF SURGERY ONLY TAKE THESE MEDICATIONS WITH SIPS OF  WATER: -omeprazole (PRILOSEC)   No Alcohol for 24 hours before or after surgery.  No Smoking including e-cigarettes for 24 hours before surgery.  No chewable tobacco products for at least 6 hours before surgery.  No nicotine patches on the day of surgery.  Do not use any "recreational" drugs for at least a week (preferably 2 weeks) before your surgery.  Please be advised that the combination of cocaine and anesthesia may have negative outcomes, up to and including death. If you test positive for cocaine, your surgery will be cancelled.  On the morning of surgery brush your teeth with toothpaste and water, you may rinse your mouth with mouthwash if you wish. Do not swallow any toothpaste or mouthwash.  Use CHG Soap as directed on instruction sheet.  Do not wear jewelry, make-up, hairpins, clips or nail polish.  For welded (permanent) jewelry: bracelets, anklets, waist bands, etc.  Please have this removed prior to surgery.  If it is not removed, there is a chance that hospital personnel will need to cut it off on the day of surgery.  Do not wear lotions, powders, or perfumes.   Do not shave body hair from the neck down 48 hours before surgery.  Contact lenses, hearing aids and dentures may not be worn into surgery.  Do not bring valuables to the hospital. [facility] is not responsible for any missing/lost belongings or valuables.   Notify your doctor if there is any change in your medical condition (cold, fever, infection).  Wear comfortable clothing (specific to your surgery type) to the hospital.  After surgery, you can help prevent lung complications by doing breathing exercises.  Take deep breaths and cough every 1-2 hours. Your doctor may order a device called an Incentive Spirometer to help you take deep breaths. When coughing or sneezing, hold a pillow firmly against your incision with both hands. This is called "splinting." Doing this helps protect your incision. It also  decreases belly discomfort.  If you are being admitted to the hospital overnight, leave your suitcase in the car. After surgery it may be brought to your room.  In case of increased patient census, it may be necessary for you, the patient, to continue your postoperative care in the Same Day Surgery department.  If you are being discharged the day of surgery, you will not be allowed to drive home. You will need a responsible individual to drive you home and stay with you for 24 hours after surgery.   If you are taking public transportation, you will need to have a responsible individual with you.  Please call the Pre-admissions Testing Dept. at [phone] if you have any questions about these instructions.  Surgery Visitation Policy:  Patients having surgery or a procedure may have two visitors.  Children under the age of 5 must have an adult with them who is not the patient.     Preparing for Surgery with CHLORHEXIDINE GLUCONATE (CHG) Soap  Chlorhexidine Gluconate (CHG) Soap  o An antiseptic cleaner that kills germs and bonds with the skin to continue killing germs even after washing  o Used for showering the night before surgery and morning of surgery  Before surgery, you can play an important role by reducing the number of germs on your skin.  CHG (Chlorhexidine gluconate) soap is an antiseptic cleanser which kills germs and bonds with the skin to continue killing germs even after washing.  Please do not use if you have an allergy to CHG or antibacterial soaps. If your skin becomes reddened/irritated stop using the CHG.  1. Shower the NIGHT BEFORE SURGERY and the MORNING OF SURGERY with CHG soap.  2. If you choose to wash your hair, wash your hair first as usual with your normal shampoo.  3. After shampooing, rinse your hair and body thoroughly to remove the shampoo.  4. Use CHG as you would any other liquid soap. You can apply CHG directly to the skin and wash gently  with a scrungie or a clean washcloth.  5. Apply the CHG soap to your body only from the neck down. Do not use on open wounds or open sores. Avoid contact with your eyes, ears, mouth, and genitals (private parts). Wash face and genitals (private parts) with your normal soap.  6. Wash thoroughly, paying special attention to the area where your surgery will be performed.  7. Thoroughly rinse your body with warm water.  8. Do not shower/wash with your normal soap after using and rinsing off the CHG soap.  9. Pat yourself dry with a clean towel.  10. Wear clean pajamas to bed the night before surgery.  12. Place clean sheets on your bed the night of your first shower and do not sleep with pets.  13. Shower again with the CHG soap on the day of surgery prior to arriving at the hospital.  14. Do not apply any deodorants/lotions/powders.  15. Please wear clean clothes to the hospital.

## 2023-08-19 ENCOUNTER — Encounter
Admission: RE | Admit: 2023-08-19 | Discharge: 2023-08-19 | Disposition: A | Discharge status: Home / Self Care | Payer: No Typology Code available for payment source | Source: Ambulatory Visit | Attending: Obstetrics and Gynecology | Admitting: Obstetrics and Gynecology

## 2023-08-19 DIAGNOSIS — Z01818 Encounter for other preprocedural examination: Secondary | ICD-10-CM

## 2023-08-19 DIAGNOSIS — R102 Pelvic and perineal pain: Secondary | ICD-10-CM | POA: Diagnosis not present

## 2023-08-19 DIAGNOSIS — Z01812 Encounter for preprocedural laboratory examination: Secondary | ICD-10-CM | POA: Insufficient documentation

## 2023-08-19 DIAGNOSIS — N80129 Deep endometriosis of ovary, unspecified ovary: Secondary | ICD-10-CM | POA: Insufficient documentation

## 2023-08-19 LAB — CBC
HCT: 41.8 % (ref 36.0–46.0)
Hemoglobin: 14.1 g/dL (ref 12.0–15.0)
MCH: 27.8 pg (ref 26.0–34.0)
MCHC: 33.7 g/dL (ref 30.0–36.0)
MCV: 82.3 fL (ref 80.0–100.0)
Platelets: 205 10*3/uL (ref 150–400)
RBC: 5.08 MIL/uL (ref 3.87–5.11)
RDW: 12.4 % (ref 11.5–15.5)
WBC: 7.8 10*3/uL (ref 4.0–10.5)
nRBC: 0 % (ref 0.0–0.2)

## 2023-08-19 LAB — TYPE AND SCREEN
ABO/RH(D): A POS
Antibody Screen: NEGATIVE

## 2023-08-22 ENCOUNTER — Encounter: Payer: Self-pay | Admitting: Obstetrics and Gynecology

## 2023-08-22 MED ORDER — TRAMADOL HCL 50 MG PO TABS: 50.0000 mg | ORAL_TABLET | Freq: Four times a day (QID) | ORAL | 0 refills | Status: DC | PRN

## 2023-08-22 NOTE — Progress Notes (Signed)
Patient called and stated she has a cold. She reports a productive cough and congestion. Spoke with Dr. Lorette Ang and he advised it would be best for patient to reschedule. Pt. was advised to call MD's office to make them aware that she needed to reschedule and she was agreeable.

## 2023-08-23 ENCOUNTER — Encounter: Payer: No Typology Code available for payment source | Admitting: Obstetrics and Gynecology

## 2023-08-30 ENCOUNTER — Encounter
Admission: RE | Admit: 2023-08-30 | Discharge: 2023-08-30 | Disposition: A | Discharge status: Home / Self Care | Payer: No Typology Code available for payment source | Source: Ambulatory Visit | Attending: Obstetrics and Gynecology | Admitting: Obstetrics and Gynecology

## 2023-08-30 DIAGNOSIS — N809 Endometriosis, unspecified: Secondary | ICD-10-CM

## 2023-08-30 DIAGNOSIS — Z01812 Encounter for preprocedural laboratory examination: Secondary | ICD-10-CM

## 2023-08-30 HISTORY — DX: Attention-deficit hyperactivity disorder, unspecified type: F90.9

## 2023-08-30 HISTORY — DX: Vitamin D deficiency, unspecified: E55.9

## 2023-08-30 NOTE — Patient Instructions (Addendum)
Your procedure is scheduled on: 09/05/2023 Monday Report to the Registration Desk on the 1st floor of the Medical Mall. To find out your arrival time, please call [phone] between 1PM - 3PM on: 09/02/2023  Friday  If your arrival time is 6:00 am, do not arrive before that time as the Medical Mall entrance doors do not open until 6:00 am.  REMEMBER: Instructions that are not followed completely may result in serious medical risk, up to and including death; or upon the discretion of your surgeon and anesthesiologist your surgery may need to be rescheduled.  Do not eat food after midnight the night before surgery.  No gum chewing or hard candies.  You may however, drink CLEAR liquids up to 2 hours before you are scheduled to arrive for your surgery. Do not drink anything within 2 hours of your scheduled arrival time.  Clear liquids include: - water  - apple juice without pulp - gatorade (not RED colors) - black coffee or tea (Do NOT add milk or creamers to the coffee or tea) Do NOT drink anything that is not on this list.    In addition, your doctor has ordered for you to drink the provided:  Ensure Pre-Surgery Clear Carbohydrate Drink  Drinking this carbohydrate drink up to two hours before surgery helps to reduce insulin resistance and improve patient outcomes. Please complete drinking 2 hours before scheduled arrival time.  One week prior to surgery: Stop Anti-inflammatories (NSAIDS) such as Advil, Aleve, Ibuprofen, Motrin, Naproxen, Naprosyn and Aspirin based products such as Excedrin, Goody's Powder, BC Powder. Stop ANY OVER THE COUNTER supplements until after surgery.  You may however, continue to take Tylenol if needed for pain up until the day of surgery. Continue taking all of your other prescription medications up until the day of surgery.  ON THE DAY OF SURGERY ONLY TAKE THESE MEDICATIONS WITH SIPS OF WATER:  omeprazole (PRILOSEC) lisdexamfetamine (VYVANSE)-or can be  taken when you get back home after surgery Tramadol -if needed.       No Alcohol for 24 hours before or after surgery.  No Smoking including e-cigarettes for 24 hours before surgery.  No chewable tobacco products for at least 6 hours before surgery.  No nicotine patches on the day of surgery.  Do not use any "recreational" drugs for at least a week (preferably 2 weeks) before your surgery.  Please be advised that the combination of cocaine and anesthesia may have negative outcomes, up to and including death. If you test positive for cocaine, your surgery will be cancelled.  On the morning of surgery brush your teeth with toothpaste and water, you may rinse your mouth with mouthwash if you wish. Do not swallow any toothpaste or mouthwash.  Use CHG Soap or wipes as directed on instruction sheet.  Do not wear jewelry, make-up, hairpins, clips or nail polish.  For welded (permanent) jewelry: bracelets, anklets, waist bands, etc.  Please have this removed prior to surgery.  If it is not removed, there is a chance that hospital personnel will need to cut it off on the day of surgery.  Do not wear lotions, powders, or perfumes.   Do not shave body hair from the neck down 48 hours before surgery.  Contact lenses, hearing aids and dentures may not be worn into surgery.  Do not bring valuables to the hospital. [facility] is not responsible for any missing/lost belongings or valuables.    Notify your doctor if there is any change in  your medical condition (cold, fever, infection).  Wear comfortable clothing (specific to your surgery type) to the hospital.  After surgery, you can help prevent lung complications by doing breathing exercises.  Take deep breaths and cough every 1-2 hours. Your doctor may order a device called an Incentive Spirometer to help you take deep breaths.  If you are being admitted to the hospital overnight, leave your suitcase in the car. After surgery it may be  brought to your room.  If you are being discharged the day of surgery, you will not be allowed to drive home. You will need a responsible individual to drive you home and stay with you for 24 hours after surgery.    Please call the Pre-admissions Testing Dept. at [phone] if you have any questions about these instructions.  Surgery Visitation Policy:  Patients having surgery or a procedure may have two visitors.  Children under the age of 7 must have an adult with them who is not the patient.  Inpatient Visitation:    Visiting hours are 7 a.m. to 8 p.m. Up to four visitors are allowed at one time in a patient room. The visitors may rotate out with other people during the day.  One visitor age 35 or older may stay with the patient overnight and must be in the room by 8 p.m.       Preparing for Surgery with CHLORHEXIDINE GLUCONATE (CHG) Soap  Chlorhexidine Gluconate (CHG) Soap  o An antiseptic cleaner that kills germs and bonds with the skin to continue killing germs even after washing  o Used for showering the night before surgery and morning of surgery  Before surgery, you can play an important role by reducing the number of germs on your skin.  CHG (Chlorhexidine gluconate) soap is an antiseptic cleanser which kills germs and bonds with the skin to continue killing germs even after washing.  Please do not use if you have an allergy to CHG or antibacterial soaps. If your skin becomes reddened/irritated stop using the CHG.  1. Shower the NIGHT BEFORE SURGERY and the MORNING OF SURGERY with CHG soap.  2. If you choose to wash your hair, wash your hair first as usual with your normal shampoo.  3. After shampooing, rinse your hair and body thoroughly to remove the shampoo.  4. Use CHG as you would any other liquid soap. You can apply CHG directly to the skin and wash gently with a scrungie or a clean washcloth.  5. Apply the CHG soap to your body only from the neck  down. Do not use on open wounds or open sores. Avoid contact with your eyes, ears, mouth, and genitals (private parts). Wash face and genitals (private parts) with your normal soap.  6. Wash thoroughly, paying special attention to the area where your surgery will be performed.  7. Thoroughly rinse your body with warm water.  8. Do not shower/wash with your normal soap after using and rinsing off the CHG soap.  9. Pat yourself dry with a clean towel.  10. Wear clean pajamas to bed the night before surgery.  12. Place clean sheets on your bed the night of your first shower and do not sleep with pets.  13. Shower again with the CHG soap on the day of surgery prior to arriving at the hospital.  14. Do not apply any deodorants/lotions/powders.  15. Please wear clean clothes to the hospital.

## 2023-08-30 NOTE — Progress Notes (Signed)
Called patient to get an update of her medications and instructions for pre-admission testing  purposes. Questions answered and patient verbalized understanding.

## 2023-08-31 ENCOUNTER — Encounter: Payer: No Typology Code available for payment source | Admitting: Obstetrics and Gynecology

## 2023-09-05 ENCOUNTER — Ambulatory Visit: Payer: No Typology Code available for payment source | Admitting: Urgent Care

## 2023-09-05 ENCOUNTER — Other Ambulatory Visit: Payer: Self-pay

## 2023-09-05 ENCOUNTER — Ambulatory Visit
Admission: RE | Admit: 2023-09-05 | Discharge: 2023-09-05 | Disposition: A | Discharge status: Home / Self Care | Payer: No Typology Code available for payment source | Source: Ambulatory Visit | Attending: Obstetrics and Gynecology | Admitting: Obstetrics and Gynecology

## 2023-09-05 ENCOUNTER — Encounter: Payer: Self-pay | Admitting: Obstetrics and Gynecology

## 2023-09-05 ENCOUNTER — Encounter
Admission: RE | Disposition: A | Discharge status: Home / Self Care | Payer: Self-pay | Source: Ambulatory Visit | Attending: Obstetrics and Gynecology

## 2023-09-05 DIAGNOSIS — K219 Gastro-esophageal reflux disease without esophagitis: Secondary | ICD-10-CM | POA: Insufficient documentation

## 2023-09-05 DIAGNOSIS — R102 Pelvic and perineal pain: Secondary | ICD-10-CM

## 2023-09-05 DIAGNOSIS — Z01818 Encounter for other preprocedural examination: Secondary | ICD-10-CM

## 2023-09-05 DIAGNOSIS — Z01812 Encounter for preprocedural laboratory examination: Secondary | ICD-10-CM

## 2023-09-05 DIAGNOSIS — N809 Endometriosis, unspecified: Secondary | ICD-10-CM

## 2023-09-05 DIAGNOSIS — N83202 Unspecified ovarian cyst, left side: Secondary | ICD-10-CM | POA: Insufficient documentation

## 2023-09-05 DIAGNOSIS — N80129 Deep endometriosis of ovary, unspecified ovary: Secondary | ICD-10-CM

## 2023-09-05 DIAGNOSIS — Z975 Presence of (intrauterine) contraceptive device: Secondary | ICD-10-CM | POA: Insufficient documentation

## 2023-09-05 DIAGNOSIS — N80122 Deep endometriosis of left ovary: Secondary | ICD-10-CM | POA: Diagnosis present

## 2023-09-05 LAB — TYPE AND SCREEN
ABO/RH(D): A POS
Antibody Screen: NEGATIVE

## 2023-09-05 LAB — POCT PREGNANCY, URINE: Preg Test, Ur: NEGATIVE

## 2023-09-05 SURGERY — EXCISION, ENDOMETRIOSIS, LAPAROSCOPIC
Anesthesia: General | Site: Pelvis | Laterality: Left

## 2023-09-05 MED ORDER — GABAPENTIN 300 MG PO CAPS
ORAL_CAPSULE | ORAL | Status: AC
  Filled 2023-09-05: qty 1

## 2023-09-05 MED ORDER — DEXAMETHASONE SODIUM PHOSPHATE 10 MG/ML IJ SOLN
INTRAMUSCULAR | Status: AC
  Filled 2023-09-05: qty 1

## 2023-09-05 MED ORDER — KETAMINE HCL 50 MG/5ML IJ SOSY
PREFILLED_SYRINGE | INTRAMUSCULAR | Status: AC
Start: 2023-09-05 — End: ?
  Filled 2023-09-05: qty 5

## 2023-09-05 MED ORDER — ORAL CARE MOUTH RINSE: 15.0000 mL | Freq: Once | OROMUCOSAL | Status: AC

## 2023-09-05 MED ORDER — MIDAZOLAM HCL 2 MG/2ML IJ SOLN
INTRAMUSCULAR | Status: DC | PRN
  Administered 2023-09-05: 2 mg via INTRAVENOUS

## 2023-09-05 MED ORDER — FENTANYL CITRATE (PF) 100 MCG/2ML IJ SOLN
INTRAMUSCULAR | Status: AC
  Filled 2023-09-05: qty 2

## 2023-09-05 MED ORDER — CHLORHEXIDINE GLUCONATE 0.12 % MT SOLN
15.0000 mL | Freq: Once | OROMUCOSAL | Status: AC
  Administered 2023-09-05: 15 mL via OROMUCOSAL

## 2023-09-05 MED ORDER — LACTATED RINGERS IV SOLN: INTRAVENOUS | Status: DC

## 2023-09-05 MED ORDER — PROPOFOL 10 MG/ML IV BOLUS
INTRAVENOUS | Status: AC
  Filled 2023-09-05: qty 20

## 2023-09-05 MED ORDER — ONDANSETRON HCL 4 MG/2ML IJ SOLN
INTRAMUSCULAR | Status: AC
  Filled 2023-09-05: qty 2

## 2023-09-05 MED ORDER — GLYCOPYRROLATE 0.2 MG/ML IJ SOLN
INTRAMUSCULAR | Status: AC
  Filled 2023-09-05: qty 1

## 2023-09-05 MED ORDER — FENTANYL CITRATE (PF) 100 MCG/2ML IJ SOLN
25.0000 ug | INTRAMUSCULAR | Status: DC | PRN
  Administered 2023-09-05 (×2): 25 ug via INTRAVENOUS

## 2023-09-05 MED ORDER — PROPOFOL 10 MG/ML IV BOLUS
INTRAVENOUS | Status: DC | PRN
  Administered 2023-09-05: 140 ug/kg/min via INTRAVENOUS
  Administered 2023-09-05: 120 mg via INTRAVENOUS

## 2023-09-05 MED ORDER — ACETAMINOPHEN 500 MG PO TABS
ORAL_TABLET | ORAL | Status: AC
  Filled 2023-09-05: qty 2

## 2023-09-05 MED ORDER — 0.9 % SODIUM CHLORIDE (POUR BTL) OPTIME
TOPICAL | Status: DC | PRN
  Administered 2023-09-05: 1000 mL

## 2023-09-05 MED ORDER — ROCURONIUM BROMIDE 100 MG/10ML IV SOLN
INTRAVENOUS | Status: DC | PRN
  Administered 2023-09-05: 40 mg via INTRAVENOUS
  Administered 2023-09-05: 10 mg via INTRAVENOUS
  Administered 2023-09-05: 20 mg via INTRAVENOUS

## 2023-09-05 MED ORDER — OXYCODONE HCL 5 MG/5ML PO SOLN: 5.0000 mg | Freq: Once | ORAL | Status: AC | PRN

## 2023-09-05 MED ORDER — OXYCODONE HCL 5 MG PO TABS: 5.0000 mg | ORAL_TABLET | Freq: Four times a day (QID) | ORAL | 0 refills | Status: AC | PRN

## 2023-09-05 MED ORDER — CELECOXIB 200 MG PO CAPS
400.0000 mg | ORAL_CAPSULE | ORAL | Status: AC
  Administered 2023-09-05: 400 mg via ORAL

## 2023-09-05 MED ORDER — GLYCOPYRROLATE 0.2 MG/ML IJ SOLN
INTRAMUSCULAR | Status: DC | PRN
  Administered 2023-09-05: .1 mg via INTRAVENOUS

## 2023-09-05 MED ORDER — ACETAMINOPHEN 10 MG/ML IV SOLN: 1000.0000 mg | Freq: Once | INTRAVENOUS | Status: DC | PRN

## 2023-09-05 MED ORDER — ONDANSETRON HCL 4 MG/2ML IJ SOLN
INTRAMUSCULAR | Status: DC | PRN
  Administered 2023-09-05: 4 mg via INTRAVENOUS

## 2023-09-05 MED ORDER — CELECOXIB 200 MG PO CAPS
ORAL_CAPSULE | ORAL | Status: AC
  Filled 2023-09-05: qty 2

## 2023-09-05 MED ORDER — DROPERIDOL 2.5 MG/ML IJ SOLN: 0.6250 mg | Freq: Once | INTRAMUSCULAR | Status: DC | PRN

## 2023-09-05 MED ORDER — CHLORHEXIDINE GLUCONATE 0.12 % MT SOLN
OROMUCOSAL | Status: AC
Start: 2023-09-05 — End: ?
  Filled 2023-09-05: qty 15

## 2023-09-05 MED ORDER — LIDOCAINE HCL (CARDIAC) PF 100 MG/5ML IV SOSY
PREFILLED_SYRINGE | INTRAVENOUS | Status: DC | PRN
  Administered 2023-09-05: 100 mg via INTRAVENOUS

## 2023-09-05 MED ORDER — GABAPENTIN 300 MG PO CAPS
300.0000 mg | ORAL_CAPSULE | ORAL | Status: AC
  Administered 2023-09-05: 300 mg via ORAL

## 2023-09-05 MED ORDER — BUPIVACAINE HCL (PF) 0.5 % IJ SOLN
INTRAMUSCULAR | Status: AC
  Filled 2023-09-05: qty 30

## 2023-09-05 MED ORDER — OXYCODONE HCL 5 MG PO TABS
5.0000 mg | ORAL_TABLET | Freq: Once | ORAL | Status: AC | PRN
  Administered 2023-09-05: 5 mg via ORAL

## 2023-09-05 MED ORDER — ROCURONIUM BROMIDE 10 MG/ML (PF) SYRINGE
PREFILLED_SYRINGE | INTRAVENOUS | Status: AC
  Filled 2023-09-05: qty 10

## 2023-09-05 MED ORDER — FENTANYL CITRATE (PF) 100 MCG/2ML IJ SOLN
INTRAMUSCULAR | Status: DC | PRN
  Administered 2023-09-05 (×4): 25 ug via INTRAVENOUS

## 2023-09-05 MED ORDER — PROPOFOL 1000 MG/100ML IV EMUL
INTRAVENOUS | Status: AC
  Filled 2023-09-05: qty 100

## 2023-09-05 MED ORDER — OXYCODONE HCL 5 MG PO TABS
ORAL_TABLET | ORAL | Status: AC
  Filled 2023-09-05: qty 1

## 2023-09-05 MED ORDER — PHENYLEPHRINE 80 MCG/ML (10ML) SYRINGE FOR IV PUSH (FOR BLOOD PRESSURE SUPPORT)
PREFILLED_SYRINGE | INTRAVENOUS | Status: AC
  Filled 2023-09-05: qty 10

## 2023-09-05 MED ORDER — LIDOCAINE HCL (PF) 2 % IJ SOLN
INTRAMUSCULAR | Status: AC
  Filled 2023-09-05: qty 5

## 2023-09-05 MED ORDER — BUPIVACAINE HCL 0.5 % IJ SOLN
INTRAMUSCULAR | Status: DC | PRN
  Administered 2023-09-05: 5 mL
  Administered 2023-09-05: 15 mL

## 2023-09-05 MED ORDER — LACTATED RINGERS IV SOLN: INTRAVENOUS | Status: AC

## 2023-09-05 MED ORDER — SUGAMMADEX SODIUM 200 MG/2ML IV SOLN
INTRAVENOUS | Status: DC | PRN
  Administered 2023-09-05: 200 mg via INTRAVENOUS

## 2023-09-05 MED ORDER — MIDAZOLAM HCL 2 MG/2ML IJ SOLN
INTRAMUSCULAR | Status: AC
Start: 2023-09-05 — End: ?
  Filled 2023-09-05: qty 2

## 2023-09-05 MED ORDER — ACETAMINOPHEN 500 MG PO TABS
1000.0000 mg | ORAL_TABLET | ORAL | Status: AC
  Administered 2023-09-05: 1000 mg via ORAL

## 2023-09-05 MED ORDER — KETAMINE HCL 50 MG/5ML IJ SOSY
PREFILLED_SYRINGE | INTRAMUSCULAR | Status: DC | PRN
  Administered 2023-09-05: 10 mg via INTRAVENOUS
  Administered 2023-09-05: 20 mg via INTRAVENOUS

## 2023-09-05 MED ORDER — DEXAMETHASONE SODIUM PHOSPHATE 10 MG/ML IJ SOLN
INTRAMUSCULAR | Status: DC | PRN
  Administered 2023-09-05: 10 mg via INTRAVENOUS

## 2023-09-05 SURGICAL SUPPLY — 44 items
BLADE SURG SZ11 CARB STEEL (BLADE) ×1 IMPLANT
CATH ROBINSON RED A/P 16FR (CATHETERS) ×1 IMPLANT
CHLORAPREP W/TINT 26 (MISCELLANEOUS) ×1 IMPLANT
CORD MONOPOLAR M/FML 12FT (MISCELLANEOUS) IMPLANT
DERMABOND ADVANCED .7 DNX12 (GAUZE/BANDAGES/DRESSINGS) ×1 IMPLANT
GAUZE 4X4 16PLY ~~LOC~~+RFID DBL (SPONGE) ×2 IMPLANT
GLOVE BIO SURGEON STRL SZ 6.5 (GLOVE) ×2 IMPLANT
GLOVE INDICATOR 7.0 STRL GRN (GLOVE) ×1 IMPLANT
GLOVE PI ORTHO PRO STRL 7.5 (GLOVE) ×1 IMPLANT
GLOVE SURG SYN 6.5 ES PF (GLOVE) ×5 IMPLANT
GLOVE SURG SYN 6.5 PF PI (GLOVE) IMPLANT
GOWN STRL REUS W/ TWL LRG LVL3 (GOWN DISPOSABLE) ×3 IMPLANT
GOWN STRL REUS W/TWL LRG LVL3 (GOWN DISPOSABLE) ×3
GOWN STRL REUS W/TWL XL LVL4 (GOWN DISPOSABLE) ×1 IMPLANT
GRASPER SUT TROCAR 14GX15 (MISCELLANEOUS) IMPLANT
IRRIGATION STRYKERFLOW (MISCELLANEOUS) ×1 IMPLANT
IRRIGATOR STRYKERFLOW (MISCELLANEOUS) ×1
IV LACTATED RINGERS 1000ML (IV SOLUTION) ×1 IMPLANT
KIT PINK PAD W/HEAD ARE REST (MISCELLANEOUS) ×1
KIT PINK PAD W/HEAD ARM REST (MISCELLANEOUS) ×1 IMPLANT
KIT TURNOVER CYSTO (KITS) ×1 IMPLANT
MANIFOLD NEPTUNE II (INSTRUMENTS) ×1 IMPLANT
NS IRRIG 500ML POUR BTL (IV SOLUTION) ×1 IMPLANT
PACK GYN LAPAROSCOPIC (MISCELLANEOUS) ×1 IMPLANT
PAD OB MATERNITY 4.3X12.25 (PERSONAL CARE ITEMS) ×1 IMPLANT
PAD PREP OB/GYN DISP 24X41 (PERSONAL CARE ITEMS) ×1 IMPLANT
SCISSORS METZENBAUM CVD 33 (INSTRUMENTS) IMPLANT
SCRUB CHG 4% DYNA-HEX 4OZ (MISCELLANEOUS) ×1 IMPLANT
SET TUBE SMOKE EVAC HIGH FLOW (TUBING) ×1 IMPLANT
SHEARS HARMONIC 36 ACE (MISCELLANEOUS) IMPLANT
SLEEVE Z-THREAD 5X100MM (TROCAR) ×1 IMPLANT
SUT VIC AB 3-0 SH 27 (SUTURE)
SUT VIC AB 3-0 SH 27X BRD (SUTURE) IMPLANT
SUT VIC AB 4-0 FS2 27 (SUTURE) ×1 IMPLANT
SUT VICRYL 0 AB UR-6 (SUTURE) IMPLANT
SUT VICRYL 0 UR6 27IN ABS (SUTURE) ×1 IMPLANT
SYR 50ML LL SCALE MARK (SYRINGE) IMPLANT
SYS BAG RETRIEVAL 10MM (BASKET) ×1
SYSTEM BAG RETRIEVAL 10MM (BASKET) IMPLANT
TRAP FLUID SMOKE EVACUATOR (MISCELLANEOUS) ×1 IMPLANT
TROCAR XCEL UNIV SLVE 11M 100M (ENDOMECHANICALS) IMPLANT
TROCAR Z-THRD FIOS HNDL 11X100 (TROCAR) ×1 IMPLANT
TROCAR Z-THREAD FIOS 5X100MM (TROCAR) ×1 IMPLANT
WATER STERILE IRR 500ML POUR (IV SOLUTION) ×1 IMPLANT

## 2023-09-05 NOTE — Transfer of Care (Signed)
Immediate Anesthesia Transfer of Care Note  Patient: Jenny Delgado  Procedure(s) Performed: LAPAROSCOPIC LEFT OOPHORECTOMY (Left: Pelvis)  Patient Location: PACU  Anesthesia Type:General  Level of Consciousness: drowsy  Airway & Oxygen Therapy: Patient Spontanous Breathing and Patient connected to face mask oxygen  Post-op Assessment: Report given to RN and Post -op Vital signs reviewed and stable  Post vital signs: Reviewed  Last Vitals:  Vitals Value Taken Time  BP 117/70   Temp    Pulse 91 09/05/23 0919  Resp 13 09/05/23 0919  SpO2 100 % 09/05/23 0919  Vitals shown include unfiled device data.  Last Pain:  Vitals:   09/05/23 0625  TempSrc: Oral  PainSc: 0-No pain      Patients Stated Pain Goal: 0 (09/05/23 4098)  Complications: No notable events documented.

## 2023-09-05 NOTE — Op Note (Signed)
Procedure(s): LAPAROSCOPIC LEFT OOPHORECTOMY Procedure Note  Jenny Delgado female 32 y.o. 09/05/2023  Indications: The patient is a 32 y.o. [id number] female with history of endometriosis, left ovarian mass (suspected endometrioma) ~ 12 cm in largest dimension. Currently with IUD in place. History of recurrent ovarian cysts.   Pre-operative Diagnosis: History of endometriosis, recurrent ovarian cyst. Currently with left ovarian mass. IUD in place.  Post-operative Diagnosis: Same  Surgeon: Hildred Laser, MD  Assistants:  Brennan Bailey, MD.   Anesthesia: General endotracheal anesthesia  Findings: The uterus was sounded to 8 cm.  IUD threads visible at cervical os, ~ 1.5 cm in length Fallopian tubes and right ovary appeared normal. Left ovary with large cystic mass present.  Peritoneal window noted in posterior cul-de-sac.  Remainder of pelvis relatively free from adhesions or endometriosis implants  Procedure Details: The patient was seen in the Holding Room. The risks, benefits, complications, treatment options, and expected outcomes were discussed with the patient.  The patient concurred with the proposed plan, giving informed consent.  The site of surgery properly noted/marked. The patient was taken to the Operating Room, identified as Jenny Delgado and the procedure verified as Procedure(s) (LRB): LAPAROSCOPIC LEFT OOPHORECTOMY (Left). A Time Out was held and the above information confirmed.  She was then placed under general anesthesia without difficulty. She was placed in the dorsal lithotomy position, and was prepped and draped in a sterile manner.  A straight catheterization was performed. A sterile speculum was inserted into the vagina and the cervix was grasped at the anterior lip using a single-toothed tenaculum.  The uterus was sounded to 8 cm, and a Hulka clamp was placed for uterine manipulation. IUD threads were visualized, ~ 1.5 cm in length. The speculum and tenaculum  were then removed. After an adequate timeout was performed, attention was turned to the abdomen where an umbilical incision was made with the scalpel.  The Optiview 5-mm trocar and sleeve were then advanced without difficulty with the laparoscope under direct visualization into the abdomen.  The abdomen was then insufflated with carbon dioxide gas and adequate pneumoperitoneum was obtained. An 11-mm left lower quadrant port and a 5-mm right lower quadrant port were then placed under direct visualization.  A survey of the patient's pelvis and abdomen revealed the findings as above.  On the left side, the infundibulopelvic ligament was clamped and transected with the Harmonic device. An aspirator needle was then inserted into the cyst and ~ 150 ml of dark red/brown fluid was drained.  An Endocatch bag was then inserted into the 11 mm trochar and the left ovary was removed.  A cone and PMI device were then inserted into the left lateral port site and the fascia of the incision was then approximated with 2 interrupted sutures using 0-Vicryl.  The subcutaneous fat layer of this incision was approximated using 2-0 Vicryl in a figure-of-eight fashion.   The pneumoperitoneum was then re-established, and the laparoscope was then introduced once again into the abdominal cavity.  A final survey was performed, where there was slight oozing along the edge of the infundibulopelvic ligament. This was managed with cautery using the Kleppinger device.  Good hemostasis was achieved. The pelvis was then thoroughly irrigated.  All other trocars were removed under direct visualization, and the abdomen which was desufflated.    All skin incisions were closed with 4-0 Monocryl subcuticular stitches. Dermabond was placed over the incisions.  The Hulka clamp was removed from the patient's uterus.   The patient tolerated  the procedures well.   All instruments, needles, and sponge counts were correct x 2. The patient was taken to the  recovery room awake, extubated and in stable condition.   An experienced assistant was required given the standard of surgical care given the complexity of the case.  This assistant was needed for exposure, dissection, suctioning, retraction, instrument exchange, and for overall help during the procedure.   Estimated Blood Loss:  5 ml      Drains: straight catheterization prior to procedure with  300 ml of clear urine         Total IV Fluids:  500 ml  Specimens: Left ovary with cyst         Implants: None         Complications:  None; patient tolerated the procedure well.         Disposition: PACU - hemodynamically stable.         Condition: stable   Hildred Laser, MD [facility]

## 2023-09-05 NOTE — Anesthesia Postprocedure Evaluation (Signed)
Anesthesia Post Note  Patient: Jenny Delgado  Procedure(s) Performed: LAPAROSCOPIC LEFT OOPHORECTOMY (Left: Pelvis)  Patient location during evaluation: PACU Anesthesia Type: General Level of consciousness: awake and alert Pain management: pain level controlled Vital Signs Assessment: post-procedure vital signs reviewed and stable Respiratory status: spontaneous breathing, nonlabored ventilation, respiratory function stable and patient connected to nasal cannula oxygen Cardiovascular status: blood pressure returned to baseline and stable Postop Assessment: no apparent nausea or vomiting Anesthetic complications: no   No notable events documented.   Last Vitals:  Vitals:   09/05/23 0955 09/05/23 1000  BP:  111/80  Pulse: 68 (!) 54  Resp: 13 11  Temp:    SpO2: 96% 99%    Last Pain:  Vitals:   09/05/23 1000  TempSrc:   PainSc: Asleep                 Yevette Edwards
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
## 2023-09-05 NOTE — Discharge Instructions (Signed)
General Gynecological Post-Operative Instructions You may expect to feel dizzy, weak, and drowsy for as long as 24 hours after receiving the medicine that made you sleep (anesthetic).  Do not drive a car, ride a bicycle, participate in physical activities, or take public transportation until you are done taking narcotic pain medicines or as directed by your doctor.  Do not drink alcohol or take tranquilizers.  Do not take medicine that has not been prescribed by your doctor.  Do not sign important papers or make important decisions while on narcotic pain medicines.  Have a responsible person with you.  CARE OF INCISION  Keep incision clean and dry. Take showers instead of baths until your doctor gives you permission to take baths.  Avoid heavy lifting (more than 10 pounds/4.5 kilograms), pushing, or pulling.  Avoid activities that may risk injury to your surgical site.  No sexual intercourse or placement of anything in the vagina for 1 week or as instructed by your doctor. If you have tubes coming from the wound site, check with your doctor regarding appropriate care of the tubes. Only take prescription or over-the-counter medicines  for pain, discomfort, or fever as directed by your doctor. Do not take aspirin. It can make you bleed. Take medicines (antibiotics) that kill germs if they are prescribed for you.  Call the office or go to the Emergency Room if:  You feel sick to your stomach (nauseous).  You start to throw up (vomit).  You have trouble eating or drinking.  You have an oral temperature above 101.  You have constipation that is not helped by adjusting diet or increasing fluid intake. Pain medicines are a common cause of constipation.  You have any other concerns. SEEK IMMEDIATE MEDICAL CARE IF:  You have persistent dizziness.  You have difficulty breathing or a congested sounding (croupy) cough.  You have an oral temperature above 102.5, not controlled by medicine.  There is  increasing pain or tenderness near or in the surgical site.

## 2023-09-05 NOTE — Anesthesia Preprocedure Evaluation (Signed)
Anesthesia Evaluation  Patient identified by MRN, date of birth, ID band Patient awake    Reviewed: Allergy & Precautions, H&P , NPO status , Patient's Chart, lab work & pertinent test results, reviewed documented beta blocker date and time   History of Anesthesia Complications (+) PONV and history of anesthetic complications  Airway Mallampati: II  TM Distance: >3 FB Neck ROM: full    Dental  (+) Teeth Intact   Pulmonary neg pulmonary ROS   Pulmonary exam normal        Cardiovascular Exercise Tolerance: Good negative cardio ROS Normal cardiovascular exam Rhythm:regular Rate:Normal     Neuro/Psych  Headaches PSYCHIATRIC DISORDERS Anxiety Depression       GI/Hepatic Neg liver ROS,GERD  Medicated,,  Endo/Other  negative endocrine ROS    Renal/GU negative Renal ROS  negative genitourinary   Musculoskeletal   Abdominal   Peds  Hematology negative hematology ROS (+)   Anesthesia Other Findings Past Medical History: No date: ADHD (attention deficit hyperactivity disorder) No date: Anxiety No date: Depression No date: Endometrioma of ovary No date: Endometriosis determined by laparoscopy No date: GERD (gastroesophageal reflux disease) No date: Headache(784.0) No date: Internal hemorrhoids No date: IUD contraception     Comment:  mirena 07/2022: Ovarian cyst, left No date: PONV (postoperative nausea and vomiting)     Comment:  nausea after c-section, bp dropped during section No date: Vitamin D deficiency Past Surgical History: No date: BREAST SURGERY; Right     Comment:  Galactocele 2008: CESAREAN SECTION 05/11/2016: CESAREAN SECTION; N/A     Comment:  Procedure: REPEAT CESAREAN SECTION;  Surgeon: Hildred Laser, MD;  Location: [facility];  Service: Obstetrics;                Laterality: N/A; No date: FOOT SURGERY; Bilateral No date: HAND SURGERY; Right 2020: HEMORRHOID BANDING 08/16/2022:  LAPAROSCOPIC OVARIAN CYSTECTOMY; Left     Comment:  Procedure: LEFT OVARIAN CYSTECTOMY and aspiration of               right ovarian cyst;  Surgeon: Hildred Laser, MD;                Location: [facility] OR[facility];  Service: Gynecology;  Laterality:               Left; No date: WISDOM TOOTH EXTRACTION BMI    Body Mass Index: 26.93 kg/m     Reproductive/Obstetrics negative OB ROS                             Anesthesia Physical Anesthesia Plan  ASA: 2  Anesthesia Plan: General ETT   Post-op Pain Management:    Induction:   PONV Risk Score and Plan: 4 or greater  Airway Management Planned:   Additional Equipment:   Intra-op Plan:   Post-operative Plan:   Informed Consent: I have reviewed the patients History and Physical, chart, labs and discussed the procedure including the risks, benefits and alternatives for the proposed anesthesia with the patient or authorized representative who has indicated his/her understanding and acceptance.     Dental Advisory Given  Plan Discussed with: CRNA  Anesthesia Plan Comments:        Anesthesia Quick Evaluation

## 2023-09-05 NOTE — Anesthesia Procedure Notes (Signed)
Procedure Name: Intubation Date/Time: 09/05/2023 7:39 AM  Performed by: Morene Crocker, CRNAPre-anesthesia Checklist: Patient identified, Patient being monitored, Timeout performed, Emergency Drugs available and Suction available Patient Re-evaluated:Patient Re-evaluated prior to induction Oxygen Delivery Method: Circle system utilized Preoxygenation: Pre-oxygenation with 100% oxygen Induction Type: IV induction Ventilation: Mask ventilation without difficulty Laryngoscope Size: 3 and McGrath Grade View: Grade I Tube type: Oral Tube size: 6.5 mm Number of attempts: 1 Airway Equipment and Method: Stylet Placement Confirmation: ETT inserted through vocal cords under direct vision, positive ETCO2 and breath sounds checked- equal and bilateral Secured at: 21 cm Tube secured with: Tape Dental Injury: Teeth and Oropharynx as per pre-operative assessment  Comments: Smooth atraumatic intubation, no complications noted

## 2023-09-06 LAB — SURGICAL PATHOLOGY

## 2023-09-12 NOTE — Progress Notes (Unsigned)
    OBSTETRICS/GYNECOLOGY POST-OPERATIVE CLINIC VISIT  Subjective:     Jenny Delgado is a 32 y.o. female who presents to the clinic 1 weeks status post LAPAROSCOPIC LEFT OOPHORECTOMY  for Recurrent left ovarian cyst, endometriosis, pelvic pain . Eating a regular diet without difficulty. Bowel movements are normal. Pain is controlled with current analgesics. Medications being used: ibuprofen (OTC) and narcotic analgesics including oxycodone/acetaminophen (Percocet, Tylox).  The following portions of the patient's history were reviewed and updated as appropriate: allergies, current medications, past family history, past medical history, past social history, past surgical history, and problem list.  Review of Systems Pertinent items are noted in HPI.   Objective:   BP 114/75   Pulse 79   Resp 16   Ht 5\' 3"  (1.6 m)   Wt 154 lb 6.4 oz (70 kg)   BMI 27.35 kg/m  Body mass index is 27.35 kg/m.  General:  alert and no distress  Abdomen: soft, bowel sounds active, non-tender  Incision:   healing well, no drainage, no erythema, no hernia, no seroma, no swelling, no dehiscence, incision well approximated. Mild amount of bruising around left  laparoscopic incision site.     Pathology:     1. Ovary, left,  :       -  ENDOMETRIOMA OF OVARY.  NEGATIVE FOR DYSPLASIA OR MALIGNANCY.   Assessment:   Postoperative visit - s/p LAPAROSCOPIC LEFT OOPHORECTOMY   Endometrioma of ovary Doing well postoperatively.   Plan:   1. Continue any current medications as instructed by provider. Requests prescription for 800 mg Ibuprofen so that she does not have to take so many tablets of the OTC per dose. Prescription given.  2. Wound care discussed. 3. Operative findings again reviewed. Pathology report discussed. 4. Activity restrictions: no bending, stooping, or squatting, no lifting more than 10-15 pounds, and pelvic rest x 1 week.  5. Anticipated return to work: 1-2 weeks. 6. Follow up:1 year for  routine exam and IUD check.      Hildred Laser, MD [facility]

## 2023-09-13 ENCOUNTER — Ambulatory Visit: Payer: No Typology Code available for payment source | Admitting: Obstetrics and Gynecology

## 2023-09-13 ENCOUNTER — Encounter: Payer: Self-pay | Admitting: Obstetrics and Gynecology

## 2023-09-13 VITALS — BP 114/75 | HR 79 | Resp 16 | Ht 63.0 in | Wt 154.4 lb

## 2023-09-13 DIAGNOSIS — N80129 Deep endometriosis of ovary, unspecified ovary: Secondary | ICD-10-CM

## 2023-09-13 DIAGNOSIS — Z4889 Encounter for other specified surgical aftercare: Secondary | ICD-10-CM

## 2023-09-13 DIAGNOSIS — Z90721 Acquired absence of ovaries, unilateral: Secondary | ICD-10-CM

## 2023-09-13 MED ORDER — IBUPROFEN 800 MG PO TABS: 800.0000 mg | ORAL_TABLET | Freq: Three times a day (TID) | ORAL | 1 refills | Status: AC | PRN

## 2023-10-18 ENCOUNTER — Other Ambulatory Visit: Payer: Self-pay | Admitting: Obstetrics and Gynecology
